# Patient Record
Sex: Male | Born: 1950 | Race: White | Hispanic: No | Marital: Married | Smoking: Never smoker
Health system: Southern US, Community
[De-identification: ages and names within clinical notes are randomized; demographics above are authoritative.]

## PROBLEM LIST (undated history)

## (undated) DIAGNOSIS — M1990 Unspecified osteoarthritis, unspecified site: Secondary | ICD-10-CM

## (undated) DIAGNOSIS — K219 Gastro-esophageal reflux disease without esophagitis: Secondary | ICD-10-CM

## (undated) DIAGNOSIS — G4730 Sleep apnea, unspecified: Secondary | ICD-10-CM

## (undated) DIAGNOSIS — Z8719 Personal history of other diseases of the digestive system: Secondary | ICD-10-CM

## (undated) HISTORY — PX: TONSILLECTOMY AND ADENOIDECTOMY: SUR1326

---
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
## 1990-08-07 HISTORY — PX: APPENDECTOMY: SHX54

## 2007-08-08 HISTORY — PX: COLONOSCOPY: SHX174

## 2008-05-04 ENCOUNTER — Ambulatory Visit: Payer: Self-pay | Admitting: General Surgery

## 2008-05-04 LAB — HM COLONOSCOPY

## 2009-08-07 HISTORY — PX: SHOULDER SURGERY: SHX246

## 2010-03-25 ENCOUNTER — Ambulatory Visit: Payer: Self-pay | Admitting: Specialist

## 2010-04-08 ENCOUNTER — Ambulatory Visit: Payer: Self-pay | Admitting: Specialist

## 2010-04-12 LAB — PATHOLOGY REPORT

## 2010-05-26 ENCOUNTER — Ambulatory Visit: Payer: Self-pay | Admitting: Family Medicine

## 2010-11-19 ENCOUNTER — Ambulatory Visit: Payer: Self-pay | Admitting: Specialist

## 2011-07-11 ENCOUNTER — Ambulatory Visit: Payer: Self-pay | Admitting: Orthopedic Surgery

## 2012-07-08 ENCOUNTER — Ambulatory Visit: Payer: Self-pay | Admitting: Specialist

## 2014-02-11 LAB — BASIC METABOLIC PANEL
CREATININE: 0.9 mg/dL (ref 0.6–1.3)
GLUCOSE: 100 mg/dL

## 2014-02-11 LAB — HEPATIC FUNCTION PANEL: Bilirubin, Total: 0.4 mg/dL

## 2014-02-11 LAB — LIPID PANEL
Cholesterol: 254 mg/dL — AB (ref 0–200)
HDL: 63 mg/dL (ref 35–70)
LDL Cholesterol: 169 mg/dL

## 2014-02-11 LAB — CBC AND DIFFERENTIAL
HEMATOCRIT: 46 % (ref 41–53)
HEMOGLOBIN: 16 g/dL (ref 13.5–17.5)
Neutrophils Absolute: 3 /uL
PLATELETS: 311 10*3/uL (ref 150–399)
WBC: 6.2 10*3/mL

## 2014-02-11 LAB — PSA: PSA: 3.6

## 2014-02-11 LAB — TSH: TSH: 2.19 u[IU]/mL (ref 0.41–5.90)

## 2014-12-09 DIAGNOSIS — Z0000 Encounter for general adult medical examination without abnormal findings: Secondary | ICD-10-CM | POA: Insufficient documentation

## 2015-03-23 ENCOUNTER — Other Ambulatory Visit: Payer: Self-pay

## 2015-03-23 DIAGNOSIS — Z0000 Encounter for general adult medical examination without abnormal findings: Secondary | ICD-10-CM

## 2015-03-23 DIAGNOSIS — Z125 Encounter for screening for malignant neoplasm of prostate: Secondary | ICD-10-CM

## 2015-04-13 LAB — CBC WITH DIFFERENTIAL/PLATELET

## 2015-04-13 LAB — COMPREHENSIVE METABOLIC PANEL
ALBUMIN: 4.9 g/dL — AB (ref 3.6–4.8)
ALT: 27 IU/L (ref 0–44)
AST: 18 IU/L (ref 0–40)
Albumin/Globulin Ratio: 2.1 (ref 1.1–2.5)
Alkaline Phosphatase: 65 IU/L (ref 39–117)
BUN / CREAT RATIO: 19 (ref 10–22)
BUN: 18 mg/dL (ref 8–27)
Bilirubin Total: 0.4 mg/dL (ref 0.0–1.2)
CALCIUM: 9.5 mg/dL (ref 8.6–10.2)
CO2: 21 mmol/L (ref 18–29)
Chloride: 102 mmol/L (ref 97–108)
Creatinine, Ser: 0.95 mg/dL (ref 0.76–1.27)
GFR, EST AFRICAN AMERICAN: 99 mL/min/{1.73_m2} (ref >59)
GFR, EST NON AFRICAN AMERICAN: 85 mL/min/{1.73_m2} (ref >59)
GLUCOSE: 112 mg/dL — AB (ref 65–99)
Globulin, Total: 2.3 g/dL (ref 1.5–4.5)
Potassium: 4.5 mmol/L (ref 3.5–5.2)
Sodium: 145 mmol/L — ABNORMAL HIGH (ref 134–144)
TOTAL PROTEIN: 7.2 g/dL (ref 6.0–8.5)

## 2015-04-13 LAB — LIPID PANEL WITH LDL/HDL RATIO
Cholesterol, Total: 256 mg/dL — ABNORMAL HIGH (ref 100–199)
HDL: 67 mg/dL (ref >39)
LDL CALC: 168 mg/dL — AB (ref 0–99)
LDL/HDL RATIO: 2.5 ratio (ref 0.0–3.6)
Triglycerides: 107 mg/dL (ref 0–149)
VLDL CHOLESTEROL CAL: 21 mg/dL (ref 5–40)

## 2015-04-13 LAB — TSH: TSH: 1.84 u[IU]/mL (ref 0.450–4.500)

## 2015-04-13 LAB — PSA: Prostate Specific Ag, Serum: 3 ng/mL (ref 0.0–4.0)

## 2015-04-16 DIAGNOSIS — R739 Hyperglycemia, unspecified: Secondary | ICD-10-CM | POA: Insufficient documentation

## 2015-06-14 ENCOUNTER — Telehealth: Payer: Self-pay

## 2015-06-14 MED ORDER — AMOXICILLIN-POT CLAVULANATE 875-125 MG PO TABS: 1.0000 | ORAL_TABLET | Freq: Two times a day (BID) | ORAL | Status: DC

## 2015-06-14 NOTE — Telephone Encounter (Signed)
Patient called and states that he has felt bad for the past 3 weeks, cough, congestion, coughing up/blowing out brown green phlegm, sore throat. Per Dr. Rosanna Randy will treat with Augmentin for 10 days and patient advised if he needs to come in due to not feeling any better we will see him

## 2015-12-09 DIAGNOSIS — J45909 Unspecified asthma, uncomplicated: Secondary | ICD-10-CM | POA: Insufficient documentation

## 2015-12-09 DIAGNOSIS — G8929 Other chronic pain: Secondary | ICD-10-CM

## 2015-12-09 DIAGNOSIS — E785 Hyperlipidemia, unspecified: Secondary | ICD-10-CM

## 2015-12-09 DIAGNOSIS — K219 Gastro-esophageal reflux disease without esophagitis: Secondary | ICD-10-CM | POA: Insufficient documentation

## 2015-12-09 DIAGNOSIS — G4733 Obstructive sleep apnea (adult) (pediatric): Secondary | ICD-10-CM

## 2015-12-09 DIAGNOSIS — F411 Generalized anxiety disorder: Secondary | ICD-10-CM

## 2015-12-09 DIAGNOSIS — J309 Allergic rhinitis, unspecified: Secondary | ICD-10-CM | POA: Insufficient documentation

## 2015-12-09 DIAGNOSIS — E782 Mixed hyperlipidemia: Secondary | ICD-10-CM | POA: Insufficient documentation

## 2015-12-09 DIAGNOSIS — J302 Other seasonal allergic rhinitis: Secondary | ICD-10-CM

## 2015-12-15 ENCOUNTER — Encounter: Payer: Self-pay | Admitting: Family Medicine

## 2015-12-15 ENCOUNTER — Ambulatory Visit (INDEPENDENT_AMBULATORY_CARE_PROVIDER_SITE_OTHER): Payer: Commercial Managed Care - HMO | Admitting: Family Medicine

## 2015-12-15 VITALS — BP 132/72 | HR 68 | Temp 98.1°F | Resp 16 | Ht 66.0 in | Wt 167.0 lb

## 2015-12-15 DIAGNOSIS — Z0000 Encounter for general adult medical examination without abnormal findings: Secondary | ICD-10-CM | POA: Diagnosis not present

## 2015-12-15 DIAGNOSIS — Z1211 Encounter for screening for malignant neoplasm of colon: Secondary | ICD-10-CM

## 2015-12-15 DIAGNOSIS — B354 Tinea corporis: Secondary | ICD-10-CM | POA: Diagnosis not present

## 2015-12-15 LAB — POCT URINALYSIS DIPSTICK
Bilirubin, UA: NEGATIVE
Glucose, UA: NEGATIVE
Ketones, UA: NEGATIVE
Leukocytes, UA: NEGATIVE
Nitrite, UA: NEGATIVE
PH UA: 6.5
PROTEIN UA: NEGATIVE
SPEC GRAV UA: 1.025
UROBILINOGEN UA: 0.2

## 2015-12-15 LAB — IFOBT (OCCULT BLOOD): IMMUNOLOGICAL FECAL OCCULT BLOOD TEST: NEGATIVE

## 2015-12-15 MED ORDER — CLOTRIMAZOLE-BETAMETHASONE 1-0.05 % EX CREA
1.0000 | TOPICAL_CREAM | Freq: Two times a day (BID) | CUTANEOUS | Status: DC
Start: 2015-12-15 — End: 2016-12-18

## 2015-12-15 NOTE — Progress Notes (Signed)
Patient ID: Jesse Church, male   DOB: 10/12/1950, 65 y.o.   MRN: [id number]       Patient: Jesse Church, Male    DOB: September 04, 1950, 65 y.o.   MRN: [id number] Visit Date: 12/15/2015  Today's Provider: Wilhemena Durie, MD   Chief Complaint  Patient presents with  . Annual Exam  . Shoulder Pain   Subjective:    Annual physical exam Jesse Church is a 65 y.o. male who presents today for health maintenance and complete physical. He feels well. He reports exercising occasionally. He reports he is sleeping well. Patient reports that he sleeps on average 6-7 hours a night.   patient is a father 60 and the grandfather of 3.  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  Of note is the patient is on no medications for this. He has no relief from nonsteroidals and very little relief from narcotics. He feels that narcotics are not worth the risk. He has seen Dr. Thressa Sheller at [facility] recently after being  followed by Dr. Tamala Julian for year. Dr. Tamala Julian retired earlier this year. Reached his maximum improvement in his shoulder issue in 2011.  Marland Kitchen-----------------------------------------------------------------   Review of Systems  Constitutional: Negative.   HENT: Negative.   Eyes: Negative.   Respiratory: Positive for cough.        Has chronic cough.   Cardiovascular: Negative.   Gastrointestinal: Negative.   Endocrine: Negative.   Genitourinary: Negative.   Musculoskeletal: Negative.   Skin: Negative.   Allergic/Immunologic: Negative.   Neurological: Negative.   Hematological: Negative.   Psychiatric/Behavioral: Negative.     Social History      He  reports that he has never smoked. He does not have any smokeless tobacco history on file. He reports that he drinks alcohol.       Social History   Social History  . Marital  Status: Married    Spouse Name: N/A  . Number of Children: N/A  . Years of Education: N/A   Social History Main Topics  . Smoking status: Never Smoker   . Smokeless tobacco: None  . Alcohol Use: 0.0 oz/week    0 Standard drinks or equivalent per week  . Drug Use: None  . Sexual Activity: Not Asked   Other Topics Concern  . None   Social History Narrative    History reviewed. No pertinent past medical history.   Patient Active Problem List   Diagnosis Date Noted  . Hyperlipidemia 12/09/2015  . GAD (generalized anxiety disorder) 12/09/2015  . OSA (obstructive sleep apnea) 12/09/2015  . Chronic pain 12/09/2015  . Allergic rhinitis 12/09/2015  . Asthma 12/09/2015  . GERD (gastroesophageal reflux disease) 12/09/2015  . Hyperglycemia 04/16/2015    Past Surgical History  Procedure Laterality Date  . Appendectomy    . Tonsillectomy and adenoidectomy    . Shoulder surgery    . Knee surgery      Family History        Family Status  Relation Status Death Age  . Mother Deceased 55  . Father Alive   . Sister Alive         His family history includes Dementia in his paternal grandmother; Hepatitis in his mother.    No Known Allergies  Previous Medications   No medications on file    Patient Care Team:  Richard Maceo Pro., MD as PCP - General (Family Medicine)     Objective:   Vitals: Ht 5\' 6"  (1.676 m)  Wt 167 lb (75.751 kg)  BMI 26.97 kg/m2   Physical Exam  Constitutional: He is oriented to person, place, and time. He appears well-developed and well-nourished.  HENT:  Head: Normocephalic and atraumatic.  Right Ear: External ear normal.  Left Ear: External ear normal.  Nose: Nose normal.  Mouth/Throat: Oropharynx is clear and moist.  Eyes: Conjunctivae are normal.  Neck: Neck supple.  Cardiovascular: Normal rate, normal heart sounds and intact distal pulses.   Pulmonary/Chest: Effort normal and breath sounds normal.  Abdominal: Soft.    Genitourinary: Rectum normal and penis normal.   Very small anus. It is too painful for the patient for many use index finger for DRE. Stool is guaiac negative.  Musculoskeletal: He exhibits no edema.   He has greatly decreased active range of motion in his shoulder and frozen right shoulder. He has severe pain with any Abduction of the right shoulder and is only able to lift his right arm about 30 before he starts to have significant pain  Neurological: He is alert and oriented to person, place, and time. No cranial nerve deficit. He exhibits normal muscle tone. Coordination normal.  Skin: Skin is warm and dry.  Psychiatric: He has a normal mood and affect. His behavior is normal. Judgment and thought content normal.     Assessment & Plan:     Routine Health Maintenance and Physical Exam  Exercise Activities and Dietary recommendations Goals    None      Immunization History  Administered Date(s) Administered  . Zoster 12/09/2014    Health Maintenance  Topic Date Due  . Hepatitis C Screening  08/12/1950  . HIV Screening  08/06/1967  . TETANUS/TDAP  08/06/1971  . INFLUENZA VACCINE  03/07/2016  . COLONOSCOPY  05/04/2018  . ZOSTAVAX  Completed    return to clinic 1 year   Repeat colonoscopy 2019. Discussed health benefits of physical activity, and encouraged him to engage in regular exercise appropriate for his age and condition.    --------------------------------------------------------------------

## 2016-12-18 ENCOUNTER — Encounter: Payer: Self-pay | Admitting: Family Medicine

## 2016-12-18 ENCOUNTER — Ambulatory Visit (INDEPENDENT_AMBULATORY_CARE_PROVIDER_SITE_OTHER): Payer: BLUE CROSS/BLUE SHIELD | Admitting: Family Medicine

## 2016-12-18 VITALS — BP 130/74 | HR 96 | Temp 98.3°F | Resp 16 | Ht 66.0 in | Wt 168.0 lb

## 2016-12-18 DIAGNOSIS — R3121 Asymptomatic microscopic hematuria: Secondary | ICD-10-CM

## 2016-12-18 DIAGNOSIS — G8929 Other chronic pain: Secondary | ICD-10-CM

## 2016-12-18 DIAGNOSIS — M25511 Pain in right shoulder: Secondary | ICD-10-CM | POA: Diagnosis not present

## 2016-12-18 DIAGNOSIS — M25512 Pain in left shoulder: Secondary | ICD-10-CM

## 2016-12-18 DIAGNOSIS — Z0000 Encounter for general adult medical examination without abnormal findings: Secondary | ICD-10-CM

## 2016-12-18 DIAGNOSIS — R319 Hematuria, unspecified: Secondary | ICD-10-CM | POA: Insufficient documentation

## 2016-12-18 DIAGNOSIS — E7800 Pure hypercholesterolemia, unspecified: Secondary | ICD-10-CM

## 2016-12-18 DIAGNOSIS — Z125 Encounter for screening for malignant neoplasm of prostate: Secondary | ICD-10-CM | POA: Diagnosis not present

## 2016-12-18 DIAGNOSIS — R739 Hyperglycemia, unspecified: Secondary | ICD-10-CM

## 2016-12-18 LAB — POCT URINALYSIS DIPSTICK
BILIRUBIN UA: NEGATIVE
GLUCOSE UA: NEGATIVE
KETONES UA: NEGATIVE
LEUKOCYTES UA: NEGATIVE
Nitrite, UA: NEGATIVE
PH UA: 6 (ref 5.0–8.0)
Protein, UA: NEGATIVE
Spec Grav, UA: 1.02 (ref 1.010–1.025)
Urobilinogen, UA: 0.2 E.U./dL

## 2016-12-18 NOTE — Progress Notes (Signed)
Patient: Jesse Church, Male    DOB: [birth year], 66 y.o.   MRN: [id number] Visit Date: 12/18/2016  Today's Provider: Wilhemena Durie, MD   Chief Complaint  Patient presents with  . Annual Exam   Subjective:    Annual physical exam Jesse Church is a 66 y.o. male who presents today for health maintenance and complete physical. He feels fairly well. Pt does have long-standing shoulder pain that prohibits him from swimming, throwing ball, etc. He reports exercising 2 times a week from 30 minutes to 2 hours. Pt cycles and walks. He reports he is sleeping well. 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 Last colonoscopy- 05/04/2008- diverticulosis, internal hemorrhoids. Repeat 10 years. -----------------------------------------------------------------   Review of Systems  Constitutional: Negative.   HENT: Negative.   Eyes: Negative.   Respiratory: Negative.   Cardiovascular: Negative.   Gastrointestinal: Negative.   Endocrine: Negative.   Genitourinary: Negative.   Musculoskeletal: Positive for arthralgias. Negative for back pain, gait problem, joint swelling, myalgias, neck pain and neck stiffness.  Skin: Negative.   Allergic/Immunologic: Positive for environmental allergies. Negative for food allergies and immunocompromised state.  Neurological: Negative.   Hematological: Negative.   Psychiatric/Behavioral: Negative.     Social History      He  reports that he has never smoked. He has never used smokeless tobacco. He reports that he drinks about 4.2 oz of alcohol per week . He reports that he does not use drugs.       Social History   Social History  . Marital status: Married    Spouse name: Butch Penny  . Number of children: 3  . Years of education: college   Occupational History  . Retired    Social History Main Topics  . Smoking status: Never Smoker  . Smokeless tobacco: Never Used  . Alcohol use 4.2 oz/week    7 Glasses of wine per week       Comment: 1 glass of wine with dinner  . Drug use: No  . Sexual activity: Not Asked   Other Topics Concern  . None   Social History Narrative  . None    History reviewed. No pertinent past medical history.   Patient Active Problem List   Diagnosis Date Noted  . Hyperlipidemia 12/09/2015  . GAD (generalized anxiety disorder) 12/09/2015  . OSA (obstructive sleep apnea) 12/09/2015  . Chronic pain 12/09/2015  . Allergic rhinitis 12/09/2015  . Asthma 12/09/2015  . GERD (gastroesophageal reflux disease) 12/09/2015  . Hyperglycemia 04/16/2015    Past Surgical History:  Procedure Laterality Date  . APPENDECTOMY    . KNEE SURGERY    . SHOULDER SURGERY    . TONSILLECTOMY AND ADENOIDECTOMY      Family History        Family Status  Relation Status  . Mother Deceased at age 75  . Father Alive  . Sister Alive  . PGM (Not Specified)        His family history includes Dementia in his father and paternal grandmother; Healthy in his sister; Hepatitis in his mother.     No Known Allergies  No current outpatient prescriptions on file.   Patient Care Team: Jerrol Banana., MD as PCP - General (Family Medicine)      Objective:   Vitals: BP 130/74 (BP Location: Left Arm, Patient Position: Sitting, Cuff Size: Normal)   Pulse 96   Temp 98.3 F (36.8 C) (Oral)  Resp 16   Ht 5\' 6"  (1.676 m)   Wt 168 lb (76.2 kg)   BMI 27.12 kg/m    Vitals:   12/18/16 1407  BP: 130/74  Pulse: 96  Resp: 16  Temp: 98.3 F (36.8 C)  TempSrc: Oral  Weight: 168 lb (76.2 kg)  Height: 5\' 6"  (1.676 m)     Physical Exam  Constitutional: He is oriented to person, place, and time. He appears well-developed and well-nourished. No distress.  HENT:  Head: Normocephalic and atraumatic.  Right Ear: Tympanic membrane and external ear normal.  Left Ear: Tympanic membrane and external ear normal.  Nose: Nose normal.  Mouth/Throat: Oropharynx is clear and moist. No oropharyngeal  exudate.  Eyes: Conjunctivae are normal. Right eye exhibits no discharge. Left eye exhibits no discharge.  Neck: Normal range of motion. Neck supple. No tracheal deviation present. No thyromegaly present.  Cardiovascular: Normal rate, regular rhythm and normal heart sounds.   Pulmonary/Chest: Effort normal and breath sounds normal. No respiratory distress.  Abdominal: Soft. There is no tenderness.  Genitourinary:  Genitourinary Comments: DRE deferred.  Musculoskeletal: Normal range of motion. He exhibits no edema.  He can raise left shoulder to 90 degrees ,right to max of 70 degrees.  Lymphadenopathy:    He has no cervical adenopathy.  Neurological: He is alert and oriented to person, place, and time. He has normal reflexes.  Skin: Skin is warm and dry. No rash noted. He is not diaphoretic. No erythema. No pallor.  Psychiatric: He has a normal mood and affect. His behavior is normal. Judgment and thought content normal.     Depression Screen PHQ 2/9 Scores 12/18/2016  PHQ - 2 Score 0  PHQ- 9 Score 0      Assessment & Plan:     Routine Health Maintenance and Physical Exam  Exercise Activities and Dietary recommendations Goals    None      Immunization History  Administered Date(s) Administered  . Zoster 12/09/2014    Health Maintenance  Topic Date Due  . Hepatitis C Screening  19-May-1951  . HIV Screening  08/06/1967  . TETANUS/TDAP  08/06/1971  . INFLUENZA VACCINE  03/07/2017  . COLONOSCOPY  05/04/2018     Discussed health benefits of physical activity, and encouraged him to engage in regular exercise appropriate for his age and condition.    -------------------------------------------------------------------- 1. Annual physical exam Stable. Colonoscopy 2019. - POCT urinalysis dipstick Results for orders placed or performed in visit on 12/18/16  POCT urinalysis dipstick  Result Value Ref Range   Color, UA yellow    Clarity, UA clear    Glucose, UA Negative     Bilirubin, UA Negative    Ketones, UA Negative    Spec Grav, UA 1.020 1.010 - 1.025   Blood, UA NH Moderate    pH, UA 6.0 5.0 - 8.0   Protein, UA Negative    Urobilinogen, UA 0.2 0.2 or 1.0 E.U./dL   Nitrite, UA Negative    Leukocytes, UA Negative Negative     2. Chronic pain of both shoulders Stable.  3. Pure hypercholesterolemia Pt has a H/O this. FU pending result. - TSH - Lipid panel - CBC with Differential/Platelet - Comprehensive metabolic panel  4. Hyperglycemia Pt has a H/O this. FU pending result. - Hemoglobin A1c  5. Screening for prostate cancer - PSA   6. Asymptomatic microscopic hematuria Pt has a H/O this. 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  I have done the exam and reviewed the above chart and it is accurate to the best of my knowledge. Development worker, community has been used in this note in any air is in the dictation or transcription are unintentional.  Wilhemena Durie, MD  [place]

## 2016-12-29 ENCOUNTER — Other Ambulatory Visit: Payer: Self-pay | Admitting: Family Medicine

## 2016-12-30 LAB — CBC WITH DIFFERENTIAL/PLATELET
Basophils Absolute: 0 10*3/uL (ref 0.0–0.2)
Basos: 1 %
EOS (ABSOLUTE): 0.1 10*3/uL (ref 0.0–0.4)
Eos: 2 %
HEMOGLOBIN: 15.5 g/dL (ref 13.0–17.7)
Hematocrit: 46.7 % (ref 37.5–51.0)
Immature Grans (Abs): 0 10*3/uL (ref 0.0–0.1)
Immature Granulocytes: 0 %
LYMPHS ABS: 2.4 10*3/uL (ref 0.7–3.1)
Lymphs: 38 %
MCH: 28.2 pg (ref 26.6–33.0)
MCHC: 33.2 g/dL (ref 31.5–35.7)
MCV: 85 fL (ref 79–97)
MONOCYTES: 10 %
Monocytes Absolute: 0.6 10*3/uL (ref 0.1–0.9)
NEUTROS ABS: 3.2 10*3/uL (ref 1.4–7.0)
Neutrophils: 49 %
Platelets: 243 10*3/uL (ref 150–379)
RBC: 5.49 x10E6/uL (ref 4.14–5.80)
RDW: 14.7 % (ref 12.3–15.4)
WBC: 6.3 10*3/uL (ref 3.4–10.8)

## 2016-12-30 LAB — LIPID PANEL
CHOLESTEROL TOTAL: 255 mg/dL — AB (ref 100–199)
Chol/HDL Ratio: 3.8 ratio (ref 0.0–5.0)
HDL: 68 mg/dL (ref >39)
LDL Calculated: 173 mg/dL — ABNORMAL HIGH (ref 0–99)
Triglycerides: 72 mg/dL (ref 0–149)
VLDL Cholesterol Cal: 14 mg/dL (ref 5–40)

## 2016-12-30 LAB — COMPREHENSIVE METABOLIC PANEL
ALBUMIN: 4.5 g/dL (ref 3.6–4.8)
ALK PHOS: 68 IU/L (ref 39–117)
ALT: 22 IU/L (ref 0–44)
AST: 19 IU/L (ref 0–40)
Albumin/Globulin Ratio: 1.7 (ref 1.2–2.2)
BILIRUBIN TOTAL: 0.6 mg/dL (ref 0.0–1.2)
BUN/Creatinine Ratio: 19 (ref 10–24)
BUN: 19 mg/dL (ref 8–27)
CO2: 23 mmol/L (ref 18–29)
Calcium: 9.6 mg/dL (ref 8.6–10.2)
Chloride: 102 mmol/L (ref 96–106)
Creatinine, Ser: 0.99 mg/dL (ref 0.76–1.27)
GFR calc non Af Amer: 80 mL/min/{1.73_m2} (ref >59)
GFR, EST AFRICAN AMERICAN: 93 mL/min/{1.73_m2} (ref >59)
Globulin, Total: 2.6 g/dL (ref 1.5–4.5)
Glucose: 112 mg/dL — ABNORMAL HIGH (ref 65–99)
Potassium: 4.3 mmol/L (ref 3.5–5.2)
SODIUM: 143 mmol/L (ref 134–144)
TOTAL PROTEIN: 7.1 g/dL (ref 6.0–8.5)

## 2016-12-30 LAB — PSA: Prostate Specific Ag, Serum: 3.9 ng/mL (ref 0.0–4.0)

## 2016-12-30 LAB — TSH: TSH: 1.83 u[IU]/mL (ref 0.450–4.500)

## 2016-12-30 LAB — HEMOGLOBIN A1C
Est. average glucose Bld gHb Est-mCnc: 120 mg/dL
Hgb A1c MFr Bld: 5.8 % — ABNORMAL HIGH (ref 4.8–5.6)

## 2017-08-07 HISTORY — PX: JOINT REPLACEMENT: SHX530

## 2017-08-07 HISTORY — PX: OTHER SURGICAL HISTORY: SHX169

## 2017-08-07 HISTORY — PX: IMPLANTABLE CONTACT LENS IMPLANTATION: SHX1792

## 2017-08-27 DIAGNOSIS — H6123 Impacted cerumen, bilateral: Secondary | ICD-10-CM | POA: Diagnosis not present

## 2017-08-27 DIAGNOSIS — H902 Conductive hearing loss, unspecified: Secondary | ICD-10-CM | POA: Diagnosis not present

## 2017-10-30 DIAGNOSIS — M170 Bilateral primary osteoarthritis of knee: Secondary | ICD-10-CM | POA: Diagnosis not present

## 2017-10-30 DIAGNOSIS — M1120 Other chondrocalcinosis, unspecified site: Secondary | ICD-10-CM | POA: Insufficient documentation

## 2017-11-15 DIAGNOSIS — M1120 Other chondrocalcinosis, unspecified site: Secondary | ICD-10-CM | POA: Diagnosis not present

## 2017-11-15 DIAGNOSIS — M170 Bilateral primary osteoarthritis of knee: Secondary | ICD-10-CM | POA: Diagnosis not present

## 2018-01-10 DIAGNOSIS — S83231A Complex tear of medial meniscus, current injury, right knee, initial encounter: Secondary | ICD-10-CM | POA: Diagnosis not present

## 2018-01-10 DIAGNOSIS — Z96651 Presence of right artificial knee joint: Secondary | ICD-10-CM | POA: Insufficient documentation

## 2018-01-17 DIAGNOSIS — S83231D Complex tear of medial meniscus, current injury, right knee, subsequent encounter: Secondary | ICD-10-CM | POA: Diagnosis not present

## 2018-01-17 DIAGNOSIS — X58XXXA Exposure to other specified factors, initial encounter: Secondary | ICD-10-CM | POA: Diagnosis not present

## 2018-01-17 DIAGNOSIS — M25461 Effusion, right knee: Secondary | ICD-10-CM | POA: Diagnosis not present

## 2018-01-17 DIAGNOSIS — S83231A Complex tear of medial meniscus, current injury, right knee, initial encounter: Secondary | ICD-10-CM | POA: Diagnosis not present

## 2018-01-17 DIAGNOSIS — M87051 Idiopathic aseptic necrosis of right femur: Secondary | ICD-10-CM | POA: Diagnosis not present

## 2018-01-17 DIAGNOSIS — S83411A Sprain of medial collateral ligament of right knee, initial encounter: Secondary | ICD-10-CM | POA: Diagnosis not present

## 2018-02-12 DIAGNOSIS — M87051 Idiopathic aseptic necrosis of right femur: Secondary | ICD-10-CM | POA: Diagnosis not present

## 2018-02-16 DIAGNOSIS — S0511XA Contusion of eyeball and orbital tissues, right eye, initial encounter: Secondary | ICD-10-CM | POA: Diagnosis not present

## 2018-02-17 DIAGNOSIS — S0511XD Contusion of eyeball and orbital tissues, right eye, subsequent encounter: Secondary | ICD-10-CM | POA: Diagnosis not present

## 2018-02-18 DIAGNOSIS — S0511XD Contusion of eyeball and orbital tissues, right eye, subsequent encounter: Secondary | ICD-10-CM | POA: Diagnosis not present

## 2018-02-19 DIAGNOSIS — S0590XA Unspecified injury of unspecified eye and orbit, initial encounter: Secondary | ICD-10-CM | POA: Diagnosis not present

## 2018-02-19 DIAGNOSIS — M170 Bilateral primary osteoarthritis of knee: Secondary | ICD-10-CM | POA: Diagnosis not present

## 2018-02-19 DIAGNOSIS — S83231S Complex tear of medial meniscus, current injury, right knee, sequela: Secondary | ICD-10-CM | POA: Diagnosis not present

## 2018-02-19 DIAGNOSIS — M25511 Pain in right shoulder: Secondary | ICD-10-CM | POA: Diagnosis not present

## 2018-02-19 DIAGNOSIS — S83231A Complex tear of medial meniscus, current injury, right knee, initial encounter: Secondary | ICD-10-CM | POA: Diagnosis not present

## 2018-02-19 DIAGNOSIS — Z01818 Encounter for other preprocedural examination: Secondary | ICD-10-CM | POA: Diagnosis not present

## 2018-02-19 DIAGNOSIS — M1120 Other chondrocalcinosis, unspecified site: Secondary | ICD-10-CM | POA: Diagnosis not present

## 2018-02-19 DIAGNOSIS — M25512 Pain in left shoulder: Secondary | ICD-10-CM | POA: Diagnosis not present

## 2018-02-19 DIAGNOSIS — G4730 Sleep apnea, unspecified: Secondary | ICD-10-CM | POA: Diagnosis not present

## 2018-02-19 DIAGNOSIS — K219 Gastro-esophageal reflux disease without esophagitis: Secondary | ICD-10-CM | POA: Diagnosis not present

## 2018-02-19 DIAGNOSIS — G8929 Other chronic pain: Secondary | ICD-10-CM | POA: Insufficient documentation

## 2018-02-19 DIAGNOSIS — E782 Mixed hyperlipidemia: Secondary | ICD-10-CM | POA: Diagnosis not present

## 2018-02-19 DIAGNOSIS — R7303 Prediabetes: Secondary | ICD-10-CM | POA: Diagnosis not present

## 2018-02-20 ENCOUNTER — Encounter: Payer: Self-pay | Admitting: Family Medicine

## 2018-02-20 ENCOUNTER — Ambulatory Visit (INDEPENDENT_AMBULATORY_CARE_PROVIDER_SITE_OTHER): Payer: Medicare Other | Admitting: Family Medicine

## 2018-02-20 VITALS — BP 142/70 | HR 88 | Temp 99.1°F | Resp 16 | Wt 168.0 lb

## 2018-02-20 DIAGNOSIS — M19212 Secondary osteoarthritis, left shoulder: Secondary | ICD-10-CM | POA: Diagnosis not present

## 2018-02-20 DIAGNOSIS — S058X1A Other injuries of right eye and orbit, initial encounter: Secondary | ICD-10-CM | POA: Diagnosis not present

## 2018-02-20 DIAGNOSIS — M25561 Pain in right knee: Secondary | ICD-10-CM

## 2018-02-20 DIAGNOSIS — M19211 Secondary osteoarthritis, right shoulder: Secondary | ICD-10-CM

## 2018-02-20 NOTE — Progress Notes (Signed)
       Patient: Jesse Church Male    DOB: Jul 13, 1951   67 y.o.   MRN: [id number] Visit Date: 02/20/2018  Today's Provider: Wilhemena Durie, MD   Chief Complaint  Patient presents with  . discuss surgery   Subjective:    HPI Patient comes in today wanting to discuss his upcoming knee surgery. He is scheduled to have total knee replacement surgery on 02/27/18 by Dr. Enrigue Catena at [facility].  He recently was struck in right eye by flying object while mowing his yard. His shoulder pain/limitation continues.    No Known Allergies   Current Outpatient Medications:  .  cyclopentolate (CYCLODRYL,CYCLOGYL) 1 % ophthalmic solution, Apply 1 drop to eye daily., Disp: , Rfl:  .  amoxicillin-clavulanate (AUGMENTIN) 875-125 MG tablet, Take 1 tablet by mouth 2 (two) times daily. (Patient not taking: Reported on 02/20/2018), Disp: 20 tablet, Rfl: 0  Review of Systems  Constitutional: Positive for activity change.  HENT: Negative.   Eyes: Positive for photophobia, pain and redness.  Cardiovascular: Negative.   Endocrine: Negative.   Musculoskeletal: Positive for arthralgias, joint swelling and myalgias.  Allergic/Immunologic: Negative.   Neurological: Negative.   Psychiatric/Behavioral: Negative.     Social History   Tobacco Use  . Smoking status: Never Smoker  . Smokeless tobacco: Never Used  Substance Use Topics  . Alcohol use: Yes    Alcohol/week: 4.2 oz    Types: 7 Glasses of wine per week    Comment: 1 glass of wine with dinner   Objective:   BP (!) 142/70 (BP Location: Left Arm, Patient Position: Sitting, Cuff Size: Normal)   Pulse 88   Temp 99.1 F (37.3 C)   Resp 16   Wt 168 lb (76.2 kg)   SpO2 96%   BMI 27.12 kg/m  Vitals:   02/20/18 1612  BP: (!) 142/70  Pulse: 88  Resp: 16  Temp: 99.1 F (37.3 C)  SpO2: 96%  Weight: 168 lb (76.2 kg)     Physical Exam  Constitutional: He appears well-developed and well-nourished.  HENT:  Head: Normocephalic and  atraumatic.  Right Ear: External ear normal.  Left Ear: External ear normal.  Nose: Nose normal.  Eyes: Conjunctivae are normal. No scleral icterus.  Right pupil dilated. Conjuctiva red.  Neck: No thyromegaly present.        Assessment & Plan:     Knee Pain Pt for partial knee arthroplasty Shoulder Arthropathy/Chronic frozen shoulder Pt completely unable to practice Ob/Gyn at all. Recent Right Eye Trauma Per Dr Michelene Heady.    I have done the exam and reviewed the chart and it is accurate to the best of my knowledge. Development worker, community has been used and  any errors in dictation or transcription are unintentional. Miguel Aschoff M.D. [place], MD  [facility]

## 2018-02-21 DIAGNOSIS — S0511XD Contusion of eyeball and orbital tissues, right eye, subsequent encounter: Secondary | ICD-10-CM | POA: Diagnosis not present

## 2018-02-25 DIAGNOSIS — H40039 Anatomical narrow angle, unspecified eye: Secondary | ICD-10-CM | POA: Diagnosis not present

## 2018-02-25 DIAGNOSIS — S0511XD Contusion of eyeball and orbital tissues, right eye, subsequent encounter: Secondary | ICD-10-CM | POA: Diagnosis not present

## 2018-02-27 ENCOUNTER — Encounter: Payer: BLUE CROSS/BLUE SHIELD | Admitting: Family Medicine

## 2018-02-27 DIAGNOSIS — G8929 Other chronic pain: Secondary | ICD-10-CM | POA: Diagnosis not present

## 2018-02-27 DIAGNOSIS — K219 Gastro-esophageal reflux disease without esophagitis: Secondary | ICD-10-CM | POA: Diagnosis not present

## 2018-02-27 DIAGNOSIS — M1712 Unilateral primary osteoarthritis, left knee: Secondary | ICD-10-CM | POA: Diagnosis not present

## 2018-02-27 DIAGNOSIS — G8918 Other acute postprocedural pain: Secondary | ICD-10-CM | POA: Diagnosis not present

## 2018-02-27 DIAGNOSIS — E782 Mixed hyperlipidemia: Secondary | ICD-10-CM | POA: Diagnosis not present

## 2018-02-27 DIAGNOSIS — M87051 Idiopathic aseptic necrosis of right femur: Secondary | ICD-10-CM | POA: Diagnosis not present

## 2018-02-27 DIAGNOSIS — S83231A Complex tear of medial meniscus, current injury, right knee, initial encounter: Secondary | ICD-10-CM | POA: Diagnosis not present

## 2018-02-27 DIAGNOSIS — M25512 Pain in left shoulder: Secondary | ICD-10-CM | POA: Diagnosis not present

## 2018-02-27 DIAGNOSIS — R7303 Prediabetes: Secondary | ICD-10-CM | POA: Diagnosis not present

## 2018-02-27 DIAGNOSIS — M25561 Pain in right knee: Secondary | ICD-10-CM | POA: Diagnosis not present

## 2018-02-27 DIAGNOSIS — M25511 Pain in right shoulder: Secondary | ICD-10-CM | POA: Diagnosis not present

## 2018-02-27 DIAGNOSIS — G4733 Obstructive sleep apnea (adult) (pediatric): Secondary | ICD-10-CM | POA: Diagnosis not present

## 2018-02-27 DIAGNOSIS — M1711 Unilateral primary osteoarthritis, right knee: Secondary | ICD-10-CM | POA: Diagnosis not present

## 2018-02-28 DIAGNOSIS — M87051 Idiopathic aseptic necrosis of right femur: Secondary | ICD-10-CM | POA: Diagnosis not present

## 2018-02-28 DIAGNOSIS — S83231A Complex tear of medial meniscus, current injury, right knee, initial encounter: Secondary | ICD-10-CM | POA: Diagnosis not present

## 2018-02-28 DIAGNOSIS — R7303 Prediabetes: Secondary | ICD-10-CM | POA: Diagnosis not present

## 2018-02-28 DIAGNOSIS — M1712 Unilateral primary osteoarthritis, left knee: Secondary | ICD-10-CM | POA: Diagnosis not present

## 2018-02-28 DIAGNOSIS — E782 Mixed hyperlipidemia: Secondary | ICD-10-CM | POA: Diagnosis not present

## 2018-02-28 DIAGNOSIS — K219 Gastro-esophageal reflux disease without esophagitis: Secondary | ICD-10-CM | POA: Diagnosis not present

## 2018-02-28 DIAGNOSIS — M1711 Unilateral primary osteoarthritis, right knee: Secondary | ICD-10-CM | POA: Diagnosis not present

## 2018-02-28 DIAGNOSIS — G4733 Obstructive sleep apnea (adult) (pediatric): Secondary | ICD-10-CM | POA: Diagnosis not present

## 2018-03-04 DIAGNOSIS — Z4789 Encounter for other orthopedic aftercare: Secondary | ICD-10-CM | POA: Diagnosis not present

## 2018-03-06 DIAGNOSIS — Z4789 Encounter for other orthopedic aftercare: Secondary | ICD-10-CM | POA: Diagnosis not present

## 2018-03-12 DIAGNOSIS — Z4789 Encounter for other orthopedic aftercare: Secondary | ICD-10-CM | POA: Diagnosis not present

## 2018-03-14 DIAGNOSIS — Z4789 Encounter for other orthopedic aftercare: Secondary | ICD-10-CM | POA: Diagnosis not present

## 2018-03-15 DIAGNOSIS — S0511XD Contusion of eyeball and orbital tissues, right eye, subsequent encounter: Secondary | ICD-10-CM | POA: Diagnosis not present

## 2018-03-18 DIAGNOSIS — Z4789 Encounter for other orthopedic aftercare: Secondary | ICD-10-CM | POA: Diagnosis not present

## 2018-03-21 DIAGNOSIS — H27111 Subluxation of lens, right eye: Secondary | ICD-10-CM | POA: Diagnosis not present

## 2018-03-25 DIAGNOSIS — Z4789 Encounter for other orthopedic aftercare: Secondary | ICD-10-CM | POA: Diagnosis not present

## 2018-03-27 DIAGNOSIS — H43811 Vitreous degeneration, right eye: Secondary | ICD-10-CM | POA: Diagnosis not present

## 2018-03-27 DIAGNOSIS — H27111 Subluxation of lens, right eye: Secondary | ICD-10-CM | POA: Diagnosis not present

## 2018-03-27 DIAGNOSIS — H27121 Anterior dislocation of lens, right eye: Secondary | ICD-10-CM | POA: Diagnosis not present

## 2018-03-27 DIAGNOSIS — H2710 Unspecified dislocation of lens: Secondary | ICD-10-CM | POA: Diagnosis not present

## 2018-03-27 DIAGNOSIS — H2701 Aphakia, right eye: Secondary | ICD-10-CM | POA: Diagnosis not present

## 2018-04-09 DIAGNOSIS — M87051 Idiopathic aseptic necrosis of right femur: Secondary | ICD-10-CM | POA: Diagnosis not present

## 2018-04-09 DIAGNOSIS — Z96651 Presence of right artificial knee joint: Secondary | ICD-10-CM | POA: Diagnosis not present

## 2018-04-09 DIAGNOSIS — Z471 Aftercare following joint replacement surgery: Secondary | ICD-10-CM | POA: Diagnosis not present

## 2018-05-28 DIAGNOSIS — Z96651 Presence of right artificial knee joint: Secondary | ICD-10-CM | POA: Diagnosis not present

## 2018-05-28 DIAGNOSIS — M25461 Effusion, right knee: Secondary | ICD-10-CM | POA: Diagnosis not present

## 2018-05-28 DIAGNOSIS — Z471 Aftercare following joint replacement surgery: Secondary | ICD-10-CM | POA: Diagnosis not present

## 2018-06-24 DIAGNOSIS — Z961 Presence of intraocular lens: Secondary | ICD-10-CM | POA: Diagnosis not present

## 2018-06-26 ENCOUNTER — Ambulatory Visit (INDEPENDENT_AMBULATORY_CARE_PROVIDER_SITE_OTHER): Payer: Medicare Other | Admitting: Family Medicine

## 2018-06-26 ENCOUNTER — Encounter: Payer: Self-pay | Admitting: Family Medicine

## 2018-06-26 VITALS — BP 142/78 | HR 84 | Temp 98.2°F | Resp 16 | Ht 66.0 in | Wt 171.0 lb

## 2018-06-26 DIAGNOSIS — Z23 Encounter for immunization: Secondary | ICD-10-CM | POA: Diagnosis not present

## 2018-06-26 DIAGNOSIS — Z0000 Encounter for general adult medical examination without abnormal findings: Secondary | ICD-10-CM | POA: Diagnosis not present

## 2018-06-26 NOTE — Progress Notes (Signed)
Patient: Jesse Church, Male    DOB: Jun 14, 1951, 67 y.o.   MRN: [id number] Visit Date: 06/26/2018  Today's Provider: Wilhemena Durie, MD   Chief Complaint  Patient presents with  . Annual Exam   Subjective:  Welcome to Medicare visit   Welcome to Medicare  visit Jesse Church Jesse Church is a 67 y.o. male. He feels well. He reports exercising not regularly, but he does stay active. He reports he is sleeping well. Patient is a retired Engineer, drilling, [place], he is married and is a father of 4.  He has 7 grandchildren and 2 more on the way.  It is of note that is 20 year old daughter was recently diagnosed with ovarian cancer.  Colonoscopy- 05/04/2008. Normal. Repeat in 10 years.    Review of Systems  Constitutional: Negative.   HENT: Negative.   Eyes: Negative.   Respiratory: Negative.   Cardiovascular: Negative.   Gastrointestinal: Negative.   Endocrine: Negative.   Genitourinary: Negative.   Musculoskeletal: Positive for arthralgias.  Skin: Negative.   Allergic/Immunologic: Negative.   Neurological: Negative.   Hematological: Negative.   Psychiatric/Behavioral: Negative.     Social History   Socioeconomic History  . Marital status: Married    Spouse name: Jesse Church  . Number of children: 3  . Years of education: college  . Highest education level: Not on file  Occupational History  . Occupation: Retired  Scientific laboratory technician  . Financial resource strain: Not on file  . Food insecurity:    Worry: Not on file    Inability: Not on file  . Transportation needs:    Medical: Not on file    Non-medical: Not on file  Tobacco Use  . Smoking status: Never Smoker  . Smokeless tobacco: Never Used  Substance and Sexual Activity  . Alcohol use: Yes    Alcohol/week: 7.0 standard drinks    Types: 7 Glasses of wine per week    Comment: 1 glass of wine with dinner  . Drug use: No  . Sexual activity: Not on file  Lifestyle  . Physical activity:    Days per week: Not on file     Minutes per session: Not on file  . Stress: Not on file  Relationships  . Social connections:    Talks on phone: Not on file    Gets together: Not on file    Attends religious service: Not on file    Active member of club or organization: Not on file    Attends meetings of clubs or organizations: Not on file    Relationship status: Not on file  . Intimate partner violence:    Fear of current or ex partner: Not on file    Emotionally abused: Not on file    Physically abused: Not on file    Forced sexual activity: Not on file  Other Topics Concern  . Not on file  Social History Narrative  . Not on file    No past medical history on file.   Patient Active Problem List   Diagnosis Date Noted  . Hematuria 12/18/2016  . Hyperlipidemia 12/09/2015  . GAD (generalized anxiety disorder) 12/09/2015  . OSA (obstructive sleep apnea) 12/09/2015  . Chronic pain 12/09/2015  . Allergic rhinitis 12/09/2015  . Asthma 12/09/2015  . GERD (gastroesophageal reflux disease) 12/09/2015  . Hyperglycemia 04/16/2015    Past Surgical History:  Procedure Laterality Date  . APPENDECTOMY    . KNEE SURGERY    .  SHOULDER SURGERY    . TONSILLECTOMY AND ADENOIDECTOMY      His family history includes Dementia in his father and paternal grandmother; Healthy in his sister; Hepatitis in his mother.      Current Outpatient Medications:  .  amoxicillin-clavulanate (AUGMENTIN) 875-125 MG tablet, Take 1 tablet by mouth 2 (two) times daily. (Patient not taking: Reported on 02/20/2018), Disp: 20 tablet, Rfl: 0 .  cyclopentolate (CYCLODRYL,CYCLOGYL) 1 % ophthalmic solution, Apply 1 drop to eye daily., Disp: , Rfl:   Patient Care Team: Jerrol Banana., MD as PCP - General (Family Medicine)     Objective:   Vitals: BP (!) 142/78 (BP Location: Left Arm, Patient Position: Sitting, Cuff Size: Normal)   Pulse 84   Temp 98.2 F (36.8 C)   Resp 16   Ht 5\' 6"  (1.676 m)   Wt 171 lb (77.6 kg)   SpO2  96%   BMI 27.60 kg/m   Physical Exam  Constitutional: He is oriented to person, place, and time. He appears well-developed and well-nourished.  HENT:  Head: Normocephalic and atraumatic.  Right Ear: External ear normal.  Left Ear: External ear normal.  Nose: Nose normal.  Mouth/Throat: Oropharynx is clear and moist.  Eyes: Conjunctivae and EOM are normal.  Right pupil is irregular and dilated from recent trauma.  Has  been surgically repaired.  Neck: No thyromegaly present.  Cardiovascular: Normal rate, regular rhythm and normal heart sounds.  Pulmonary/Chest: Effort normal and breath sounds normal.  Abdominal: Soft.  Genitourinary: Penis normal.  Musculoskeletal: He exhibits no edema.  He has very decreased range of motion in both shoulders and has had for several years.  Lymphadenopathy:    He has no cervical adenopathy.  Neurological: He is alert and oriented to person, place, and time.  Skin: Skin is warm and dry.  Psychiatric: He has a normal mood and affect. His behavior is normal. Judgment and thought content normal.    Activities of Daily Living In your present state of health, do you have any difficulty performing the following activities: 06/26/2018  Hearing? N  Vision? N  Difficulty concentrating or making decisions? N  Walking or climbing stairs? Y  Dressing or bathing? N  Doing errands, shopping? N  Some recent data might be hidden    Fall Risk Assessment Fall Risk  06/26/2018 12/18/2016  Falls in the past year? 0 No     Depression Screen PHQ 2/9 Scores 06/26/2018 12/18/2016  PHQ - 2 Score 0 0  PHQ- 9 Score - 0    Cognitive Testing - 6-CIT  Correct? Score   What year is it? yes 0 0 or 4  What month is it? yes 0 0 or 3  Memorize:    Jesse Church,  43,  [place],      What time is it? (within 1 hour) yes 0 0 or 3  Count backwards from 20 yes 0 0, 2, or 4  Name the months of the year yes 0 0, 2, or 4  Repeat name & address above yes 0 0, 2,  4, 6, 8, or 10       TOTAL SCORE  0/28   Interpretation:  Normal  Normal (0-7) Abnormal (8-28)       Assessment & Plan:     Welcome to Medicare Visit  Reviewed patient's Family Medical History Reviewed and updated list of patient's medical providers Assessment of cognitive impairment was done Assessed patient's functional ability  Established a written schedule for health screening [place] Completed and Reviewed  Exercise Activities and Dietary recommendations Goals   None     Immunization History  Administered Date(s) Administered  . Zoster 12/09/2014    Health Maintenance  Topic Date Due  . Hepatitis C Screening  May 02, 1951  . HIV Screening  08/06/1967  . TETANUS/TDAP  08/06/1971  . PNA vac Low Risk Adult (1 of 2 - PCV13) 08/05/2017  . INFLUENZA VACCINE  03/07/2018  . COLONOSCOPY  05/04/2018     Discussed health benefits of physical activity, and encouraged him to engage in regular exercise appropriate for his age and condition.  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  He will consider trying to  use this again.   ------------------------------------------------------------------------------------------------------------   I have done the exam and reviewed the above chart and it is accurate to the best of my knowledge. Development worker, community has been used in this note in any air is in the dictation or transcription are unintentional.  Wilhemena Durie, MD  [place]

## 2018-08-12 DIAGNOSIS — H2512 Age-related nuclear cataract, left eye: Secondary | ICD-10-CM | POA: Diagnosis not present

## 2018-08-20 ENCOUNTER — Telehealth: Payer: Self-pay | Admitting: *Deleted

## 2018-08-20 NOTE — Telephone Encounter (Signed)
Patient to want to see Dr. Bary Castilla . We will call back to reschedule.

## 2018-08-27 ENCOUNTER — Encounter: Payer: Self-pay | Admitting: Family Medicine

## 2018-08-27 ENCOUNTER — Ambulatory Visit (INDEPENDENT_AMBULATORY_CARE_PROVIDER_SITE_OTHER): Payer: Medicare Other | Admitting: Family Medicine

## 2018-08-27 ENCOUNTER — Ambulatory Visit: Payer: Self-pay | Admitting: General Surgery

## 2018-08-27 VITALS — BP 139/74 | HR 75 | Temp 98.2°F | Wt 173.4 lb

## 2018-08-27 DIAGNOSIS — R5383 Other fatigue: Secondary | ICD-10-CM

## 2018-08-27 DIAGNOSIS — M19212 Secondary osteoarthritis, left shoulder: Secondary | ICD-10-CM | POA: Diagnosis not present

## 2018-08-27 DIAGNOSIS — E7800 Pure hypercholesterolemia, unspecified: Secondary | ICD-10-CM

## 2018-08-27 DIAGNOSIS — Z125 Encounter for screening for malignant neoplasm of prostate: Secondary | ICD-10-CM

## 2018-08-27 DIAGNOSIS — K219 Gastro-esophageal reflux disease without esophagitis: Secondary | ICD-10-CM | POA: Diagnosis not present

## 2018-08-27 DIAGNOSIS — M19211 Secondary osteoarthritis, right shoulder: Secondary | ICD-10-CM

## 2018-08-27 DIAGNOSIS — R739 Hyperglycemia, unspecified: Secondary | ICD-10-CM

## 2018-08-27 NOTE — Progress Notes (Signed)
Patient: Jesse Church Male    DOB: 30-Nov-1950   68 y.o.   MRN: [id number] Visit Date: 08/27/2018  Today's Provider: Wilhemena Durie, MD   Chief Complaint  Patient presents with  . Hyperlipidemia   Subjective:    HPI  Lipid/Cholesterol, Follow-up:   Last seen for thismonths ago.  Management changes since that visit include none. . Last Lipid Panel:    Component Value Date/Time   CHOL 255 (H) 12/29/2016 0819   TRIG 72 12/29/2016 0819   HDL 68 12/29/2016 0819   CHOLHDL 3.8 12/29/2016 0819   LDLCALC 173 (H) 12/29/2016 0819    Risk factors for vascular disease include hypercholesterolemia  He reports good compliance with treatment. He is not having side effects.  Current symptoms include none and have been stable. Weight trend: stable Prior visit with dietician: no Current diet: well balanced Current exercise: none  Wt Readings from Last 3 Encounters:  08/27/18 173 lb 6.4 oz (78.7 kg)  06/26/18 171 lb (77.6 kg)  02/20/18 168 lb (76.2 kg)    -------------------------------------------------------------------    No Known Allergies   Current Outpatient Medications:  .  amoxicillin-clavulanate (AUGMENTIN) 875-125 MG tablet, Take 1 tablet by mouth 2 (two) times daily. (Patient not taking: Reported on 02/20/2018), Disp: 20 tablet, Rfl: 0 .  cyclopentolate (CYCLODRYL,CYCLOGYL) 1 % ophthalmic solution, Apply 1 drop to eye daily., Disp: , Rfl:   Review of Systems  Constitutional: Negative.   HENT: Negative.   Eyes: Negative.   Respiratory: Negative.   Gastrointestinal: Negative.   Endocrine: Negative.   Genitourinary: Negative.   Allergic/Immunologic: Negative.   Neurological: Negative.   Psychiatric/Behavioral: Negative.     Social History   Tobacco Use  . Smoking status: Never Smoker  . Smokeless tobacco: Never Used  Substance Use Topics  . Alcohol use: Yes    Alcohol/week: 7.0 standard drinks    Types: 7 Glasses of wine per week   Comment: 1 glass of wine with dinner      Objective:   BP 139/74 (BP Location: Right Arm, Patient Position: Sitting, Cuff Size: Normal)   Pulse 75   Temp 98.2 F (36.8 C) (Oral)   Wt 173 lb 6.4 oz (78.7 kg)   BMI 27.99 kg/m  Vitals:   08/27/18 1112  BP: 139/74  Pulse: 75  Temp: 98.2 F (36.8 C)  TempSrc: Oral  Weight: 173 lb 6.4 oz (78.7 kg)     Physical Exam Constitutional:      Appearance: He is well-developed.  HENT:     Head: Normocephalic and atraumatic.     Right Ear: External ear normal.     Left Ear: External ear normal.     Nose: Nose normal.  Eyes:     Conjunctiva/sclera: Conjunctivae normal.     Comments: Right pupil is irregular and dilated from recent trauma.  Has  been surgically repaired.  Neck:     Thyroid: No thyromegaly.  Cardiovascular:     Rate and Rhythm: Normal rate and regular rhythm.     Heart sounds: Normal heart sounds.  Pulmonary:     Effort: Pulmonary effort is normal.     Breath sounds: Normal breath sounds.  Abdominal:     Palpations: Abdomen is soft.  Musculoskeletal:     Comments: He has very decreased range of motion in both shoulders and has had for several years.  Lymphadenopathy:     Cervical: No cervical adenopathy.  Skin:  General: Skin is warm and dry.  Neurological:     Mental Status: He is alert and oriented to person, place, and time.  Psychiatric:        Mood and Affect: Mood normal.        Behavior: Behavior normal.        Thought Content: Thought content normal.        Judgment: Judgment normal.         Assessment & Plan    1. 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  2. Hyperglycemia   3. Pure hypercholesterolemia  - CBC with Differential/Platelet - Comprehensive metabolic panel - Lipid panel  4. Gastroesophageal reflux disease, esophagitis presence not specified   5. Other fatigue  - TSH  6. Screening for prostate cancer  - PSA    I  have done the exam and reviewed the above chart and it is accurate to the best of my knowledge. Development worker, community has been used in this note in any air is in the dictation or transcription are unintentional.  Wilhemena Durie, MD  [place]

## 2018-09-05 ENCOUNTER — Encounter: Payer: Self-pay | Admitting: *Deleted

## 2018-09-05 ENCOUNTER — Other Ambulatory Visit: Payer: Self-pay

## 2018-09-05 ENCOUNTER — Encounter: Payer: Self-pay | Admitting: General Surgery

## 2018-09-05 ENCOUNTER — Ambulatory Visit (INDEPENDENT_AMBULATORY_CARE_PROVIDER_SITE_OTHER): Payer: Medicare Other | Admitting: General Surgery

## 2018-09-05 VITALS — BP 166/76 | HR 100 | Resp 18 | Ht 66.0 in | Wt 170.4 lb

## 2018-09-05 DIAGNOSIS — Z1211 Encounter for screening for malignant neoplasm of colon: Secondary | ICD-10-CM | POA: Diagnosis not present

## 2018-09-05 NOTE — Patient Instructions (Signed)
Colonoscopy, Adult A colonoscopy is an exam to look at the entire large intestine. During the exam, a lubricated, flexible tube that has a camera on the end of it is inserted into the anus and then passed into the rectum, colon, and other parts of the large intestine. You may have a colonoscopy as a part of normal colorectal screening or if you have certain symptoms, such as:  Lack of red blood cells (anemia).  Diarrhea that does not go away.  Abdominal pain.  Blood in your stool (feces). A colonoscopy can help screen for and diagnose medical problems, including:  Tumors.  Polyps.  Inflammation.  Areas of bleeding. Tell a health care provider about:  Any allergies you have.  All medicines you are taking, including vitamins, herbs, eye drops, creams, and over-the-counter medicines.  Any problems you or family members have had with anesthetic medicines.  Any blood disorders you have.  Any surgeries you have had.  Any medical conditions you have.  Any problems you have had passing stool. What are the risks? Generally, this is a safe procedure. However, problems may occur, including:  Bleeding.  A tear in the intestine.  A reaction to medicines given during the exam.  Infection (rare). What happens before the procedure? Eating and drinking restrictions Follow instructions from your health care provider about eating and drinking, which may include:  A few days before the procedure - follow a low-fiber diet. Avoid nuts, seeds, dried fruit, raw fruits, and vegetables.  1-3 days before the procedure - follow a clear liquid diet. Drink only clear liquids, such as clear broth or bouillon, black coffee or tea, clear juice, clear soft drinks or sports drinks, gelatin dessert, and popsicles. Avoid any liquids that contain red or purple dye.  On the day of the procedure - do not eat or drink anything starting 2 hours before the procedure, or within the time period that your  health care provider recommends. Up to 2 hours before the procedure, you may continue to drink clear liquids, such as water or clear fruit juice. Bowel prep If you were prescribed an oral bowel prep to clean out your colon:  Take it as told by your health care provider. Starting the day before your procedure, you will need to drink a large amount of medicated liquid. The liquid will cause you to have multiple loose stools until your stool is almost clear or light green.  If your skin or anus gets irritated from diarrhea, you may use these to relieve the irritation: ? Medicated wipes, such as adult wet wipes with aloe and vitamin E. ? A skin-soothing product like petroleum jelly.  If you vomit while drinking the bowel prep, take a break for up to 60 minutes and then begin the bowel prep again. If vomiting continues and you cannot take the bowel prep without vomiting, call your health care provider.  To clean out your colon, you may also be given: ? Laxative medicines. ? Instructions about how to use an enema. General instructions  Ask your health care provider about: ? Changing or stopping your regular medicines or supplements. This is especially important if you are taking iron supplements, diabetes medicines, or blood thinners. ? Taking medicines such as aspirin and ibuprofen. These medicines can thin your blood. Do not take these medicines before the procedure if your health care provider tells you not to.  Plan to have someone take you home from the hospital or clinic. What happens during the procedure?     An IV may be inserted into one of your veins.  You will be given medicine to help you relax (sedative).  To reduce your risk of infection: ? Your health care team will wash or sanitize their hands. ? Your anal area will be washed with soap.  You will be asked to lie on your side with your knees bent.  Your health care provider will lubricate a long, thin, flexible tube. The  tube will have a camera and a light on the end.  The tube will be inserted into your anus.  The tube will be gently eased through your rectum and colon.  Air will be delivered into your colon to keep it open. You may feel some pressure or cramping.  The camera will be used to take images during the procedure.  A small tissue sample may be removed to be examined under a microscope (biopsy).  If small polyps are found, your health care provider may remove them and have them checked for cancer cells.  When the exam is done, the tube will be removed. The procedure may vary among health care providers and hospitals. What happens after the procedure?  Your blood pressure, heart rate, breathing rate, and blood oxygen level will be monitored until the medicines you were given have worn off.  Do not drive for 24 hours after the exam.  You may have a small amount of blood in your stool.  You may pass gas and have mild abdominal cramping or bloating due to the air that was used to inflate your colon during the exam.  It is up to you to get the results of your procedure. Ask your health care provider, or the department performing the procedure, when your results will be ready. Summary  A colonoscopy is an exam to look at the entire large intestine.  During a colonoscopy, a lubricated, flexible tube with a camera on the end of it is inserted into the anus and then passed into the colon and other parts of the large intestine.  Follow instructions from your health care provider about eating and drinking before the procedure.  If you were prescribed an oral bowel prep to clean out your colon, take it as told by your health care provider.  After your procedure, your blood pressure, heart rate, breathing rate, and blood oxygen level will be monitored until the medicines you were given have worn off. This information is not intended to replace advice given to you by your health care provider. Make  sure you discuss any questions you have with your health care provider. Document Released: 07/21/2000 Document Revised: 05/16/2017 Document Reviewed: 10/05/2015 Elsevier Interactive Patient Education  2019 Elsevier Inc.  

## 2018-09-05 NOTE — Progress Notes (Signed)
Patient ID: Jesse Church, male   DOB: 09/03/1950, 68 y.o.   MRN: [id number]  Chief Complaint  Patient presents with  . Colonoscopy    val colonoscopy last one done by Dr Jesse Church 10 years ago    HPI Jesse Church is a 68 y.o. male.  Who presents for a colonoscopy discussion. The last colonoscopy was completed in 2009. Denies any gastrointestinal issues. Bowels move regular and are loose, bleeding is noted 2-3 times a year on tissue.  The patient reports he has multiple loose stools daily, up to 5-6/day.  Baseline. The patient's oldest daughter was recently diagnosed with a granular cell tumor of the ovary and is starting chemotherapy at [facility].  HPI  No past medical history on file.  Past Surgical History:  Procedure Laterality Date  . APPENDECTOMY  1992  . COLONOSCOPY  2009  . IMPLANTABLE CONTACT LENS IMPLANTATION Right 2019  . JOINT REPLACEMENT Right 2019   partial  . KNEE SURGERY  1969  . SHOULDER SURGERY Right 2011  . TONSILLECTOMY AND ADENOIDECTOMY      Family History  Problem Relation Age of Onset  . Hepatitis Mother   . Dementia Father   . Healthy Sister   . Dementia Paternal Grandmother   . Colon cancer Neg Hx     Social History Social History   Tobacco Use  . Smoking status: Never Smoker  . Smokeless tobacco: Never Used  Substance Use Topics  . Alcohol use: Yes    Alcohol/week: 7.0 standard drinks    Types: 7 Glasses of wine per week    Comment: 1 glass of wine with dinner  . Drug use: No    No Known Allergies  No current outpatient medications on file.   No current facility-administered medications for this visit.     Review of Systems Review of Systems  Constitutional: Negative.   Respiratory: Negative.   Cardiovascular: Negative.   Gastrointestinal: Negative for constipation and diarrhea.    Blood pressure (!) 166/76, pulse 100, resp. rate 18, height 5\' 6"  (1.676 m), weight 170 lb 6.4 oz (77.3 kg), SpO2 95 %.  Physical  Exam Physical Exam Vitals signs reviewed.  Constitutional:      Appearance: Normal appearance.  Neck:     Musculoskeletal: Normal range of motion and neck supple.  Cardiovascular:     Rate and Rhythm: Normal rate and regular rhythm.  Pulmonary:     Effort: Pulmonary effort is normal.     Breath sounds: Normal breath sounds.  Neurological:     Mental Status: He is alert.     Data Reviewed Colonoscopy of May 04, 2008 reviewed.  A few small mouth diverticuli noted in the sigmoid colon.  Small internal hemorrhoids.  No bleeding.  Assessment    Candidate for screening colonoscopy versus Cologuard testing.    Plan    Pros and cons of colonoscopy/Cologuard reviewed.  At this time the patient is interested in pursuing with colonoscopy.  Colonoscopy with possible biopsy/polypectomy prn: Information regarding the procedure, including its potential risks and complications (including but not limited to perforation of the bowel, which may require emergency surgery to repair, and bleeding) was verbally given to the patient. Educational information regarding lower intestinal endoscopy was given to the patient. Written instructions for how to complete the bowel prep using Miralax were provided. The importance of drinking ample fluids to avoid dehydration as a result of the prep emphasized.    HPI, Physical Exam, Assessment  and Plan have been scribed under the direction and in the presence of Jesse Bellow, MD. Jesse Fetch, RN  I have completed the exam and reviewed the above documentation for accuracy and completeness.  I agree with the above.  Haematologist has been used and any errors in dictation or transcription are unintentional.  Jesse Church, M.D., F.A.C.S. Jesse Church Jesse Church 09/05/2018, 10:19 AM

## 2018-09-05 NOTE — Progress Notes (Signed)
Patient wishes to call the office when he is ready to schedule colonoscopy.   The patient states it may be a few months before he get this scheduled but he will call.

## 2018-09-17 DIAGNOSIS — E7800 Pure hypercholesterolemia, unspecified: Secondary | ICD-10-CM | POA: Diagnosis not present

## 2018-09-17 DIAGNOSIS — R5383 Other fatigue: Secondary | ICD-10-CM | POA: Diagnosis not present

## 2018-09-17 DIAGNOSIS — Z125 Encounter for screening for malignant neoplasm of prostate: Secondary | ICD-10-CM | POA: Diagnosis not present

## 2018-09-19 LAB — CBC WITH DIFFERENTIAL/PLATELET
BASOS: 1 %
Basophils Absolute: 0.1 10*3/uL (ref 0.0–0.2)
EOS (ABSOLUTE): 0.1 10*3/uL (ref 0.0–0.4)
Eos: 2 %
Hematocrit: 46.1 % (ref 37.5–51.0)
Hemoglobin: 15.8 g/dL (ref 13.0–17.7)
Immature Grans (Abs): 0 10*3/uL (ref 0.0–0.1)
Immature Granulocytes: 0 %
Lymphocytes Absolute: 2.3 10*3/uL (ref 0.7–3.1)
Lymphs: 36 %
MCH: 28.7 pg (ref 26.6–33.0)
MCHC: 34.3 g/dL (ref 31.5–35.7)
MCV: 84 fL (ref 79–97)
MONOS ABS: 0.7 10*3/uL (ref 0.1–0.9)
Monocytes: 11 %
NEUTROS ABS: 3.2 10*3/uL (ref 1.4–7.0)
Neutrophils: 50 %
PLATELETS: 274 10*3/uL (ref 150–450)
RBC: 5.5 x10E6/uL (ref 4.14–5.80)
RDW: 14.2 % (ref 11.6–15.4)
WBC: 6.5 10*3/uL (ref 3.4–10.8)

## 2018-09-19 LAB — COMPREHENSIVE METABOLIC PANEL
A/G RATIO: 1.9 (ref 1.2–2.2)
ALT: 26 IU/L (ref 0–44)
AST: 18 IU/L (ref 0–40)
Albumin: 4.6 g/dL (ref 3.8–4.8)
Alkaline Phosphatase: 69 IU/L (ref 39–117)
BILIRUBIN TOTAL: 0.5 mg/dL (ref 0.0–1.2)
BUN/Creatinine Ratio: 15 (ref 10–24)
BUN: 14 mg/dL (ref 8–27)
CHLORIDE: 102 mmol/L (ref 96–106)
CO2: 24 mmol/L (ref 20–29)
Calcium: 9.6 mg/dL (ref 8.6–10.2)
Creatinine, Ser: 0.96 mg/dL (ref 0.76–1.27)
GFR calc Af Amer: 95 mL/min/{1.73_m2} (ref >59)
GFR calc non Af Amer: 82 mL/min/{1.73_m2} (ref >59)
GLOBULIN, TOTAL: 2.4 g/dL (ref 1.5–4.5)
Glucose: 102 mg/dL — ABNORMAL HIGH (ref 65–99)
POTASSIUM: 4.4 mmol/L (ref 3.5–5.2)
SODIUM: 142 mmol/L (ref 134–144)
TOTAL PROTEIN: 7 g/dL (ref 6.0–8.5)

## 2018-09-19 LAB — LIPID PANEL
Chol/HDL Ratio: 3.6 ratio (ref 0.0–5.0)
Cholesterol, Total: 249 mg/dL — ABNORMAL HIGH (ref 100–199)
HDL: 70 mg/dL (ref >39)
LDL Calculated: 159 mg/dL — ABNORMAL HIGH (ref 0–99)
Triglycerides: 100 mg/dL (ref 0–149)
VLDL Cholesterol Cal: 20 mg/dL (ref 5–40)

## 2018-09-19 LAB — PSA: Prostate Specific Ag, Serum: 4.7 ng/mL — ABNORMAL HIGH (ref 0.0–4.0)

## 2018-09-19 LAB — TSH: TSH: 1.93 u[IU]/mL (ref 0.450–4.500)

## 2018-09-24 ENCOUNTER — Encounter: Payer: Self-pay | Admitting: General Surgery

## 2018-11-27 DIAGNOSIS — R972 Elevated prostate specific antigen [PSA]: Secondary | ICD-10-CM | POA: Diagnosis not present

## 2018-11-27 DIAGNOSIS — R3121 Asymptomatic microscopic hematuria: Secondary | ICD-10-CM | POA: Diagnosis not present

## 2018-12-02 ENCOUNTER — Other Ambulatory Visit: Payer: Self-pay | Admitting: Urology

## 2018-12-02 DIAGNOSIS — R972 Elevated prostate specific antigen [PSA]: Secondary | ICD-10-CM

## 2018-12-16 ENCOUNTER — Other Ambulatory Visit: Payer: Self-pay

## 2018-12-26 DIAGNOSIS — R972 Elevated prostate specific antigen [PSA]: Secondary | ICD-10-CM | POA: Diagnosis not present

## 2019-01-09 ENCOUNTER — Other Ambulatory Visit: Payer: Self-pay

## 2019-01-09 ENCOUNTER — Ambulatory Visit
Admission: RE | Admit: 2019-01-09 | Discharge: 2019-01-09 | Disposition: A | Discharge status: Home / Self Care | Payer: Medicare Other | Source: Ambulatory Visit | Attending: Urology | Admitting: Urology

## 2019-01-09 DIAGNOSIS — R972 Elevated prostate specific antigen [PSA]: Secondary | ICD-10-CM

## 2019-01-09 MED ORDER — GADOBENATE DIMEGLUMINE 529 MG/ML IV SOLN
15.0000 mL | Freq: Once | INTRAVENOUS | Status: AC | PRN
  Administered 2019-01-09: 15 mL via INTRAVENOUS

## 2019-02-17 DIAGNOSIS — Z96651 Presence of right artificial knee joint: Secondary | ICD-10-CM | POA: Diagnosis not present

## 2019-02-17 DIAGNOSIS — M87051 Idiopathic aseptic necrosis of right femur: Secondary | ICD-10-CM | POA: Diagnosis not present

## 2019-02-17 DIAGNOSIS — Z471 Aftercare following joint replacement surgery: Secondary | ICD-10-CM | POA: Diagnosis not present

## 2019-03-05 ENCOUNTER — Ambulatory Visit (INDEPENDENT_AMBULATORY_CARE_PROVIDER_SITE_OTHER): Payer: Medicare Other | Admitting: Family Medicine

## 2019-03-05 ENCOUNTER — Encounter: Payer: Self-pay | Admitting: Family Medicine

## 2019-03-05 ENCOUNTER — Other Ambulatory Visit: Payer: Self-pay

## 2019-03-05 VITALS — BP 161/75 | HR 85 | Temp 98.2°F | Resp 18 | Wt 170.4 lb

## 2019-03-05 DIAGNOSIS — G4733 Obstructive sleep apnea (adult) (pediatric): Secondary | ICD-10-CM | POA: Diagnosis not present

## 2019-03-05 DIAGNOSIS — Z23 Encounter for immunization: Secondary | ICD-10-CM

## 2019-03-05 DIAGNOSIS — R972 Elevated prostate specific antigen [PSA]: Secondary | ICD-10-CM | POA: Diagnosis not present

## 2019-03-05 DIAGNOSIS — M19212 Secondary osteoarthritis, left shoulder: Secondary | ICD-10-CM | POA: Diagnosis not present

## 2019-03-05 NOTE — Progress Notes (Signed)
Patient: Jesse Church Male    DOB: 07-13-1951   68 y.o.   MRN: [id number] Visit Date: 03/05/2019  Today's Provider: Wilhemena Durie, MD   Chief Complaint  Patient presents with  . Follow-up   Subjective:     HPI  Follow up  His chronic shoulder pain and limitation of range of motion is slowly getting worse. He is using CPAP nightly. Elevated PSA followed by Dr Alinda Money. F/u colonoscopy per Dr Bary Castilla. BP at home 120-138/70s to 80s.  He and his wife now have 44 GC.  No Known Allergies  No current outpatient medications on file.  Review of Systems  Constitutional: Negative.   HENT: Negative.   Eyes: Negative.   Respiratory: Negative.   Gastrointestinal: Negative.   Endocrine: Negative.   Genitourinary: Negative.   Musculoskeletal: Positive for arthralgias.  Skin: Negative.   Allergic/Immunologic: Negative.   Neurological: Negative.   Psychiatric/Behavioral: Negative.   All other systems reviewed and are negative.   Social History   Tobacco Use  . Smoking status: Never Smoker  . Smokeless tobacco: Never Used  Substance Use Topics  . Alcohol use: Yes    Alcohol/week: 7.0 standard drinks    Types: 7 Glasses of wine per week    Comment: 1 glass of wine with dinner      Objective:   BP (!) 161/75 (BP Location: Right Arm, Patient Position: Sitting, Cuff Size: Normal)   Pulse 85   Temp 98.2 F (36.8 C) (Oral)   Resp 18   Wt 170 lb 6.4 oz (77.3 kg)   SpO2 97%   BMI 27.50 kg/m  Vitals:   03/05/19 1053  BP: (!) 161/75  Pulse: 85  Resp: 18  Temp: 98.2 F (36.8 C)  TempSrc: Oral  SpO2: 97%  Weight: 170 lb 6.4 oz (77.3 kg)     Physical Exam Vitals signs reviewed.  Constitutional:      Appearance: He is well-developed.  HENT:     Head: Normocephalic and atraumatic.     Right Ear: External ear normal.     Left Ear: External ear normal.     Nose: Nose normal.  Eyes:     Conjunctiva/sclera: Conjunctivae normal.     Comments: Right  pupil is irregular and dilated from recent trauma.  Has  been surgically repaired.  Neck:     Thyroid: No thyromegaly.  Cardiovascular:     Rate and Rhythm: Normal rate and regular rhythm.     Heart sounds: Normal heart sounds.  Pulmonary:     Effort: Pulmonary effort is normal.     Breath sounds: Normal breath sounds.  Abdominal:     Palpations: Abdomen is soft.  Musculoskeletal:     Comments: He has very decreased range of motion in both shoulders and has had for several years.  Lymphadenopathy:     Cervical: No cervical adenopathy.  Skin:    General: Skin is warm and dry.  Neurological:     Mental Status: He is alert and oriented to person, place, and time.  Psychiatric:        Mood and Affect: Mood normal.        Behavior: Behavior normal.        Thought Content: Thought content normal.        Judgment: Judgment normal.      No results found for any visits on 03/05/19.     Assessment & Plan    1.  Need for zoster vaccine  - Varicella-zoster vaccine IM (Shingrix)  2. Secondary osteoarthritis of left shoulder due to rotator cuff arthropathy This is slowly getting worse. Pt unable to work as Software engineer.  3. OSA (obstructive sleep apnea) Nightly CPAP  4. Elevated PSA Dr Cloretta Ned Cranford Mon, MD  [facility]

## 2019-03-14 DIAGNOSIS — M19212 Secondary osteoarthritis, left shoulder: Secondary | ICD-10-CM | POA: Insufficient documentation

## 2019-05-14 ENCOUNTER — Ambulatory Visit (INDEPENDENT_AMBULATORY_CARE_PROVIDER_SITE_OTHER): Payer: Medicare Other

## 2019-05-14 ENCOUNTER — Other Ambulatory Visit: Payer: Self-pay

## 2019-05-14 DIAGNOSIS — Z23 Encounter for immunization: Secondary | ICD-10-CM | POA: Diagnosis not present

## 2019-07-08 DIAGNOSIS — U071 COVID-19: Secondary | ICD-10-CM

## 2019-07-08 HISTORY — DX: COVID-19: U07.1

## 2019-07-09 DIAGNOSIS — Z23 Encounter for immunization: Secondary | ICD-10-CM | POA: Insufficient documentation

## 2019-07-09 DIAGNOSIS — J069 Acute upper respiratory infection, unspecified: Secondary | ICD-10-CM | POA: Insufficient documentation

## 2019-07-09 DIAGNOSIS — B9689 Other specified bacterial agents as the cause of diseases classified elsewhere: Secondary | ICD-10-CM | POA: Insufficient documentation

## 2019-07-10 ENCOUNTER — Ambulatory Visit (INDEPENDENT_AMBULATORY_CARE_PROVIDER_SITE_OTHER): Payer: Medicare Other | Admitting: Family Medicine

## 2019-07-10 ENCOUNTER — Other Ambulatory Visit: Payer: Self-pay

## 2019-07-10 DIAGNOSIS — Z23 Encounter for immunization: Secondary | ICD-10-CM

## 2019-07-10 NOTE — Progress Notes (Signed)
Vaccine only

## 2019-07-16 DIAGNOSIS — R972 Elevated prostate specific antigen [PSA]: Secondary | ICD-10-CM | POA: Diagnosis not present

## 2019-07-21 ENCOUNTER — Encounter: Payer: Self-pay | Admitting: Family Medicine

## 2019-07-28 ENCOUNTER — Encounter: Payer: Self-pay | Admitting: Family Medicine

## 2019-07-28 MED ORDER — PREDNISONE 20 MG PO TABS: 20.0000 mg | ORAL_TABLET | Freq: Two times a day (BID) | ORAL | 0 refills | Status: AC

## 2019-08-27 ENCOUNTER — Telehealth: Payer: Self-pay

## 2019-08-27 DIAGNOSIS — R972 Elevated prostate specific antigen [PSA]: Secondary | ICD-10-CM | POA: Diagnosis not present

## 2019-08-27 NOTE — Telephone Encounter (Signed)
Please advise 

## 2019-08-27 NOTE — Telephone Encounter (Signed)
Copied from [place] [phone]. Topic: General - Inquiry >> Aug 27, 2019  9:32 AM Jesse Church, NT wrote: Reason for CRM: Pt called in stating he would like to have the covid-19 antibody test done. Please advise when order is placed.

## 2019-09-01 ENCOUNTER — Other Ambulatory Visit: Payer: Self-pay

## 2019-09-01 DIAGNOSIS — U071 COVID-19: Secondary | ICD-10-CM

## 2019-09-01 NOTE — Telephone Encounter (Signed)
Please call patient and advise that labs have been ordered, will send lab slip next door

## 2019-09-01 NOTE — Telephone Encounter (Signed)
Patient advised.

## 2019-09-02 DIAGNOSIS — U071 COVID-19: Secondary | ICD-10-CM | POA: Diagnosis not present

## 2019-09-02 DIAGNOSIS — H2512 Age-related nuclear cataract, left eye: Secondary | ICD-10-CM | POA: Diagnosis not present

## 2019-09-03 LAB — SARS-COV-2 ANTIBODIES: SARS-CoV-2 Antibodies: POSITIVE — AB

## 2019-09-19 DIAGNOSIS — Z23 Encounter for immunization: Secondary | ICD-10-CM | POA: Diagnosis not present

## 2019-10-10 DIAGNOSIS — Z23 Encounter for immunization: Secondary | ICD-10-CM | POA: Diagnosis not present

## 2019-10-16 ENCOUNTER — Other Ambulatory Visit: Payer: Self-pay | Admitting: Urology

## 2019-10-16 ENCOUNTER — Other Ambulatory Visit (HOSPITAL_COMMUNITY): Payer: Self-pay | Admitting: Urology

## 2019-10-16 DIAGNOSIS — R972 Elevated prostate specific antigen [PSA]: Secondary | ICD-10-CM

## 2019-11-10 ENCOUNTER — Encounter (HOSPITAL_BASED_OUTPATIENT_CLINIC_OR_DEPARTMENT_OTHER): Payer: Self-pay | Admitting: Urology

## 2019-11-10 ENCOUNTER — Other Ambulatory Visit: Payer: Self-pay

## 2019-11-10 NOTE — Progress Notes (Signed)
Spoke w/ via phone for pre-op interview---patient Lab needs dos----   none            COVID test ------11-13-2019 1000 at [facility] at -------900 am 11-17-2019 NPO after ------midnight Medications to take morning of surgery -----none Diabetic medication -----n/a Patient Special Instructions -----fleets enema am of surgery Pre-Op special Istructions -----none Patient verbalized understanding of instructions that were given at this phone interview. Patient denies shortness of breath, chest pain, fever, cough a this phone interview.

## 2019-11-13 ENCOUNTER — Other Ambulatory Visit
Admission: RE | Admit: 2019-11-13 | Discharge: 2019-11-13 | Disposition: A | Discharge status: Home / Self Care | Payer: Medicare Other | Source: Ambulatory Visit | Attending: Urology | Admitting: Urology

## 2019-11-13 ENCOUNTER — Other Ambulatory Visit: Payer: Self-pay

## 2019-11-13 DIAGNOSIS — Z01812 Encounter for preprocedural laboratory examination: Secondary | ICD-10-CM | POA: Diagnosis not present

## 2019-11-13 DIAGNOSIS — Z20822 Contact with and (suspected) exposure to covid-19: Secondary | ICD-10-CM | POA: Insufficient documentation

## 2019-11-13 LAB — SARS CORONAVIRUS 2 (TAT 6-24 HRS): SARS Coronavirus 2: NEGATIVE

## 2019-11-14 NOTE — H&P (Signed)
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 At his initial visit with me last spring, we repeated his PSA which was 3.81. He ultimately underwent an MRI of the prostate in June that did not demonstrate any suspicious lesions. Based on that information, we elected to continue with surveillance monitoring of his PSA. He returns today and states that he has had no significant changes in his overall health. He denies any new voiding symptoms.     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 He was noted to have a rising PSA over the past few years and increased to 3.81 in April 2020. He underwent an MRI of the prostate in June 2020 which was unremarkable and he elected to continue to monitor his PSA.   He has no family history of prostate cancer.   Apr 2020: PSA 3.81  Jun 2020: MRI - unremarkable   2) Hematuria: He has a long history of microscopic hematuria starting as a teenager. He underwent an IVP that was negative. He denies tobacco use. He has no family history of GU malignancy.   NON-GU PMH: Arthritis GERD Hypercholesterolemia Sleep Apnea    FAMILY HISTORY: liver disease - Mother    Notes: 1 son, 2 daughters   SOCIAL HISTORY: Marital Status: Married Preferred Language: English; Ethnicity: Not Hispanic Or Latino; Race: White Current Smoking Status: Patient has never smoked.   Tobacco Use Assessment  Completed: Used Tobacco in last 30 days? Does not use smokeless tobacco. Drinks 1 drink per day. Types of alcohol consumed: Wine.  Does not use drugs. Drinks 1 caffeinated drink per day. Has not had a blood transfusion.    REVIEW OF SYSTEMS:    GU Review Male:   Patient reports get up at night to urinate. Patient denies frequent urination, hard to postpone urination, burning/ pain with urination, leakage of urine, stream starts and stops, trouble starting your streams, and have to strain to urinate .  Gastrointestinal (Lower):   Patient denies diarrhea and constipation.  Gastrointestinal (Upper):   Patient denies nausea and vomiting.  Constitutional:   Patient denies fever, night sweats, weight loss, and fatigue.  Skin:   Patient denies skin rash/ lesion and itching.  Eyes:   Patient denies blurred vision and double vision.  Ears/ Nose/ Throat:   Patient denies sore throat and sinus problems.  Hematologic/Lymphatic:   Patient denies swollen glands and easy bruising.  Cardiovascular:   Patient denies leg swelling and chest pains.  Respiratory:   Patient denies cough and shortness of breath.  Endocrine:   Patient denies excessive thirst.  Musculoskeletal:   Patient denies back pain and joint pain.  Neurological:   Patient denies headaches and dizziness.  Psychologic:   Patient denies depression and anxiety.   VITAL SIGNS:      Weight 158 lb / 71.67 kg  Height 66 in / 167.64 cm  BMI 25.5 kg/m    MULTI-SYSTEM PHYSICAL EXAMINATION:    Constitutional: Well-nourished. No physical deformities. Normally developed. Good grooming.  CV: RRR Lungs: Clear    PAST DATA REVIEWED:  Source Of History:  Patient  Lab Test Review:   PSA  Records Review:   Previous Patient Records  Urine Test Review:   Urinalysis  X-Ray Review: MRI Prostate GSORAD: Reviewed Films.     07/17/19 11/27/18  PSA  Total PSA 4.25 ng/mL 3.81 ng/mL  Free PSA 0.62 ng/mL   % Free PSA 15 % PSA     ASSESSMENT:       ICD-10 Details  1 GU:   Elevated PSA - R97.20    PLAN:      1. Elevated PSA: He will proceed with a prostate biopsy.  Due to his inability to tolerate a biopsy in the office, this will be performed in the operating room.

## 2019-11-17 ENCOUNTER — Encounter (HOSPITAL_BASED_OUTPATIENT_CLINIC_OR_DEPARTMENT_OTHER)
Admission: RE | Disposition: A | Discharge status: Home / Self Care | Payer: Self-pay | Source: Home / Self Care | Attending: Urology

## 2019-11-17 ENCOUNTER — Ambulatory Visit (HOSPITAL_BASED_OUTPATIENT_CLINIC_OR_DEPARTMENT_OTHER): Payer: Medicare Other | Admitting: Anesthesiology

## 2019-11-17 ENCOUNTER — Ambulatory Visit (HOSPITAL_COMMUNITY)
Admission: RE | Admit: 2019-11-17 | Discharge: 2019-11-17 | Disposition: A | Discharge status: Home / Self Care | Payer: Medicare Other | Source: Ambulatory Visit | Attending: Urology | Admitting: Urology

## 2019-11-17 ENCOUNTER — Encounter (HOSPITAL_BASED_OUTPATIENT_CLINIC_OR_DEPARTMENT_OTHER): Payer: Self-pay | Admitting: Urology

## 2019-11-17 ENCOUNTER — Ambulatory Visit (HOSPITAL_BASED_OUTPATIENT_CLINIC_OR_DEPARTMENT_OTHER)
Admission: RE | Admit: 2019-11-17 | Discharge: 2019-11-17 | Disposition: A | Discharge status: Home / Self Care | Payer: Medicare Other | Attending: Urology | Admitting: Urology

## 2019-11-17 DIAGNOSIS — R972 Elevated prostate specific antigen [PSA]: Secondary | ICD-10-CM

## 2019-11-17 DIAGNOSIS — R3129 Other microscopic hematuria: Secondary | ICD-10-CM | POA: Insufficient documentation

## 2019-11-17 DIAGNOSIS — D291 Benign neoplasm of prostate: Secondary | ICD-10-CM | POA: Insufficient documentation

## 2019-11-17 DIAGNOSIS — J45909 Unspecified asthma, uncomplicated: Secondary | ICD-10-CM | POA: Diagnosis not present

## 2019-11-17 DIAGNOSIS — G4730 Sleep apnea, unspecified: Secondary | ICD-10-CM | POA: Diagnosis not present

## 2019-11-17 DIAGNOSIS — E7800 Pure hypercholesterolemia, unspecified: Secondary | ICD-10-CM | POA: Insufficient documentation

## 2019-11-17 DIAGNOSIS — M1990 Unspecified osteoarthritis, unspecified site: Secondary | ICD-10-CM | POA: Diagnosis not present

## 2019-11-17 DIAGNOSIS — K449 Diaphragmatic hernia without obstruction or gangrene: Secondary | ICD-10-CM | POA: Insufficient documentation

## 2019-11-17 DIAGNOSIS — K219 Gastro-esophageal reflux disease without esophagitis: Secondary | ICD-10-CM | POA: Diagnosis not present

## 2019-11-17 HISTORY — PX: PROSTATE BIOPSY: SHX241

## 2019-11-17 HISTORY — PX: CYSTOSCOPY: SHX5120

## 2019-11-17 HISTORY — DX: Gastro-esophageal reflux disease without esophagitis: K21.9

## 2019-11-17 HISTORY — DX: Sleep apnea, unspecified: G47.30

## 2019-11-17 HISTORY — DX: Personal history of other diseases of the digestive system: Z87.19

## 2019-11-17 HISTORY — DX: Unspecified osteoarthritis, unspecified site: M19.90

## 2019-11-17 SURGERY — CYSTOSCOPY, FLEXIBLE
Anesthesia: General | Site: Prostate

## 2019-11-17 MED ORDER — FLEET ENEMA 7-19 GM/118ML RE ENEM
1.0000 | ENEMA | Freq: Once | RECTAL | Status: DC
  Filled 2019-11-17: qty 1

## 2019-11-17 MED ORDER — SODIUM CHLORIDE 0.9 % IV SOLN
INTRAVENOUS | Status: DC
  Filled 2019-11-17: qty 1000

## 2019-11-17 MED ORDER — CELECOXIB 200 MG PO CAPS
ORAL_CAPSULE | ORAL | Status: AC
  Filled 2019-11-17: qty 1

## 2019-11-17 MED ORDER — DEXAMETHASONE SODIUM PHOSPHATE 10 MG/ML IJ SOLN
INTRAMUSCULAR | Status: DC | PRN
  Administered 2019-11-17: 5 mg via INTRAVENOUS

## 2019-11-17 MED ORDER — CEFTRIAXONE SODIUM 2 G IJ SOLR
INTRAMUSCULAR | Status: AC
  Filled 2019-11-17: qty 20

## 2019-11-17 MED ORDER — SUCCINYLCHOLINE CHLORIDE 200 MG/10ML IV SOSY
PREFILLED_SYRINGE | INTRAVENOUS | Status: AC
  Filled 2019-11-17: qty 10

## 2019-11-17 MED ORDER — ROCURONIUM BROMIDE 10 MG/ML (PF) SYRINGE
PREFILLED_SYRINGE | INTRAVENOUS | Status: AC
  Filled 2019-11-17: qty 10

## 2019-11-17 MED ORDER — SODIUM CHLORIDE 0.9 % IR SOLN
Status: DC | PRN
  Administered 2019-11-17: 200 mL via INTRAVESICAL

## 2019-11-17 MED ORDER — DEXAMETHASONE SODIUM PHOSPHATE 10 MG/ML IJ SOLN
INTRAMUSCULAR | Status: AC
  Filled 2019-11-17: qty 1

## 2019-11-17 MED ORDER — PHENYLEPHRINE 40 MCG/ML (10ML) SYRINGE FOR IV PUSH (FOR BLOOD PRESSURE SUPPORT)
PREFILLED_SYRINGE | INTRAVENOUS | Status: DC | PRN
  Administered 2019-11-17 (×2): 160 ug via INTRAVENOUS

## 2019-11-17 MED ORDER — LIDOCAINE 2% (20 MG/ML) 5 ML SYRINGE
INTRAMUSCULAR | Status: DC | PRN
  Administered 2019-11-17: 60 mg via INTRAVENOUS

## 2019-11-17 MED ORDER — ONDANSETRON HCL 4 MG/2ML IJ SOLN
INTRAMUSCULAR | Status: DC | PRN
  Administered 2019-11-17: 4 mg via INTRAVENOUS

## 2019-11-17 MED ORDER — PROMETHAZINE HCL 25 MG/ML IJ SOLN
6.2500 mg | INTRAMUSCULAR | Status: DC | PRN
  Filled 2019-11-17: qty 1

## 2019-11-17 MED ORDER — CELECOXIB 200 MG PO CAPS
200.0000 mg | ORAL_CAPSULE | Freq: Once | ORAL | Status: AC
  Administered 2019-11-17: 09:00:00 200 mg via ORAL
  Filled 2019-11-17: qty 1

## 2019-11-17 MED ORDER — SODIUM CHLORIDE 0.9 % IV SOLN
INTRAVENOUS | Status: AC
  Filled 2019-11-17: qty 100

## 2019-11-17 MED ORDER — MIDAZOLAM HCL 2 MG/2ML IJ SOLN
INTRAMUSCULAR | Status: DC | PRN
  Administered 2019-11-17: 2 mg via INTRAVENOUS

## 2019-11-17 MED ORDER — SODIUM CHLORIDE 0.9 % IV SOLN
2.0000 g | Freq: Once | INTRAVENOUS | Status: AC
  Administered 2019-11-17: 10:00:00 2 g via INTRAVENOUS
  Filled 2019-11-17: qty 20

## 2019-11-17 MED ORDER — ONDANSETRON HCL 4 MG/2ML IJ SOLN
INTRAMUSCULAR | Status: AC
  Filled 2019-11-17: qty 2

## 2019-11-17 MED ORDER — LIDOCAINE 2% (20 MG/ML) 5 ML SYRINGE
INTRAMUSCULAR | Status: AC
  Filled 2019-11-17: qty 5

## 2019-11-17 MED ORDER — ACETAMINOPHEN 500 MG PO TABS
ORAL_TABLET | ORAL | Status: AC
  Filled 2019-11-17: qty 2

## 2019-11-17 MED ORDER — PROPOFOL 10 MG/ML IV BOLUS
INTRAVENOUS | Status: DC | PRN
  Administered 2019-11-17: 150 mg via INTRAVENOUS

## 2019-11-17 MED ORDER — MIDAZOLAM HCL 2 MG/2ML IJ SOLN
INTRAMUSCULAR | Status: AC
  Filled 2019-11-17: qty 2

## 2019-11-17 MED ORDER — PHENYLEPHRINE 40 MCG/ML (10ML) SYRINGE FOR IV PUSH (FOR BLOOD PRESSURE SUPPORT)
PREFILLED_SYRINGE | INTRAVENOUS | Status: AC
  Filled 2019-11-17: qty 10

## 2019-11-17 MED ORDER — FENTANYL CITRATE (PF) 100 MCG/2ML IJ SOLN
INTRAMUSCULAR | Status: AC
  Filled 2019-11-17: qty 2

## 2019-11-17 MED ORDER — PROPOFOL 10 MG/ML IV BOLUS
INTRAVENOUS | Status: AC
  Filled 2019-11-17: qty 40

## 2019-11-17 MED ORDER — FENTANYL CITRATE (PF) 100 MCG/2ML IJ SOLN
25.0000 ug | INTRAMUSCULAR | Status: DC | PRN
  Filled 2019-11-17: qty 1

## 2019-11-17 MED ORDER — ACETAMINOPHEN 500 MG PO TABS
1000.0000 mg | ORAL_TABLET | Freq: Once | ORAL | Status: AC
  Administered 2019-11-17: 1000 mg via ORAL
  Filled 2019-11-17: qty 2

## 2019-11-17 MED ORDER — FENTANYL CITRATE (PF) 100 MCG/2ML IJ SOLN
INTRAMUSCULAR | Status: DC | PRN
  Administered 2019-11-17 (×2): 50 ug via INTRAVENOUS

## 2019-11-17 SURGICAL SUPPLY — 28 items
BAG DRAIN URO-CYSTO SKYTR STRL (DRAIN) ×3 IMPLANT
CLOTH BEACON ORANGE TIMEOUT ST (SAFETY) ×4 IMPLANT
DRSG TELFA 3X8 NADH (GAUZE/BANDAGES/DRESSINGS) ×3 IMPLANT
ELECT REM PT RETURN 9FT ADLT (ELECTROSURGICAL)
ELECTRODE REM PT RTRN 9FT ADLT (ELECTROSURGICAL) ×2 IMPLANT
GLOVE BIO SURGEON STRL SZ7.5 (GLOVE) ×3 IMPLANT
GOWN STRL REUS W/ TWL XL LVL3 (GOWN DISPOSABLE) ×2 IMPLANT
GOWN STRL REUS W/TWL XL LVL3 (GOWN DISPOSABLE) ×1
INST BIOPSY MAXCORE 18GX25 (NEEDLE) IMPLANT
INSTR BIOPSY MAXCORE 18GX20 (NEEDLE) ×1 IMPLANT
IV NS IRRIG 3000ML ARTHROMATIC (IV SOLUTION) ×1 IMPLANT
KIT TURNOVER CYSTO (KITS) ×3 IMPLANT
MANIFOLD NEPTUNE II (INSTRUMENTS) ×1 IMPLANT
NDL SAFETY ECLIPSE 18X1.5 (NEEDLE) IMPLANT
NDL SPNL 22GX7 QUINCKE BK (NEEDLE) IMPLANT
NEEDLE HYPO 18GX1.5 SHARP (NEEDLE)
NEEDLE HYPO 22GX1.5 SAFETY (NEEDLE) IMPLANT
NEEDLE SPNL 22GX7 QUINCKE BK (NEEDLE) IMPLANT
NS IRRIG 500ML POUR BTL (IV SOLUTION) IMPLANT
PACK CYSTO (CUSTOM PROCEDURE TRAY) ×3 IMPLANT
PAD DRESSING TELFA 3X8 NADH (GAUZE/BANDAGES/DRESSINGS) ×2 IMPLANT
SURGILUBE 2OZ TUBE FLIPTOP (MISCELLANEOUS) ×3 IMPLANT
SYR 20ML LL LF (SYRINGE) IMPLANT
TOWEL OR 17X26 10 PK STRL BLUE (TOWEL DISPOSABLE) ×1 IMPLANT
TUBE CONNECTING 12X1/4 (SUCTIONS) ×1 IMPLANT
UNDERPAD 30X30 (UNDERPADS AND DIAPERS) ×3 IMPLANT
WATER STERILE IRR 3000ML UROMA (IV SOLUTION) ×2 IMPLANT
WATER STERILE IRR 500ML POUR (IV SOLUTION) ×2 IMPLANT

## 2019-11-17 NOTE — Anesthesia Procedure Notes (Signed)
Procedure Name: LMA Insertion Date/Time: 11/17/2019 10:34 AM Performed by: Mechele Claude, CRNA Pre-anesthesia Checklist: Patient identified, Emergency Drugs available, Suction available and Patient being monitored Patient Re-evaluated:Patient Re-evaluated prior to induction Oxygen Delivery Method: Circle system utilized Preoxygenation: Pre-oxygenation with 100% oxygen Induction Type: IV induction Ventilation: Mask ventilation without difficulty LMA: LMA inserted LMA Size: 4.0 Number of attempts: 1 Airway Equipment and Method: Bite block Placement Confirmation: positive ETCO2 Tube secured with: Tape Dental Injury: Teeth and Oropharynx as per pre-operative assessment

## 2019-11-17 NOTE — Op Note (Signed)
Preoperative diagnosis:  1.  Microscopic hematuria 2.  Elevated PSA  Postoperative diagnosis: 1.  Microscopic hematuria 2.  Elevated PSA  Procedures: 1.  Flexible cystoscopy 2.  Transrectal ultrasound of the prostate with ultrasound-guided biopsy of the prostate  Surgeon: Pryor Curia MD  Anesthesia: General  Complications: None  EBL: Minimal  Specimens: 1.  Right lateral base prostate 2.  Right parasagittal base prostate 3.  Right lateral mid prostate 4.  Right parasagittal mid prostate 5.  Right lateral apex prostate 6.  Right parasagittal apex prostate 7.  Left lateral base prostate 8.  Left parasagittal base prostate 9.  Left lateral mid prostate 10.  Left parasagittal mid prostate 11.  Left lateral apex prostate 12.  Left parasagittal apex prostate  Disposition of specimens: Pathology  Indication: This is a 69 year old gentleman with a history of microscopic hematuria and elevated PSA.  He presents for the above procedures.  The potential risks, complications, and expected recovery process was discussed in detail.  Informed consent was obtained.  Due to his inability to tolerate a prostate biopsy in the office, this is being performed in the operating room.  Description of procedure: The patient was taken to the operating room and a general anesthetic was administered.  He was given preoperative antibiotics, placed in the supine position, and prepped and draped in the usual sterile fashion.  A preoperative timeout was performed.  Flexible cystourethroscopy was then performed revealing a normal anterior and posterior urethra.  Inspection of the bladder revealed no bladder tumors, stones, or other mucosal pathology.  His prostatic urethra was significant for an intravesical median lobe.  The cystoscope was then withdrawn and the patient was repositioned in the left lateral decubitus position.  The transrectal ultrasound was placed allowing visualization of the  prostate.  Sequential images were taken of the prostate in the transverse and sagittal views.  There was some generalized hypoechoic echotexture of the peripheral zone of the prostate without discrete lesions.  There was a median lobe noted as previously described.  Prostate volume was measured at 53.3 cc.  Using ultrasound guidance, each biopsy was then performed.  Biopsies were taken from the standard sextant areas of the prostate on both the lateral and parasagittal areas of each region.  A total of 12 biopsies were obtained with the exact locations as noted above.  At the end of the procedure, there was not any significant bleeding noted per rectum.  The patient tolerated the procedure well and without complications.  He was able to be awakened and transferred to the recovery unit in satisfactory condition.

## 2019-11-17 NOTE — Anesthesia Preprocedure Evaluation (Addendum)
Anesthesia Evaluation  Patient identified by MRN, date of birth, ID band Patient awake    Reviewed: Allergy & Precautions, NPO status , Patient's Chart, lab work & pertinent test results, reviewed documented beta blocker date and time   Airway Mallampati: III  TM Distance: >3 FB Neck ROM: Limited    Dental  (+) Dental Advisory Given   Pulmonary asthma , sleep apnea ,    breath sounds clear to auscultation       Cardiovascular negative cardio ROS   Rhythm:Regular Rate:Normal     Neuro/Psych negative neurological ROS     GI/Hepatic Neg liver ROS, hiatal hernia, GERD  ,  Endo/Other  negative endocrine ROS  Renal/GU negative Renal ROS     Musculoskeletal  (+) Arthritis ,   Abdominal   Peds  Hematology negative hematology ROS (+)   Anesthesia Other Findings   Reproductive/Obstetrics                            Lab Results  Component Value Date   WBC 6.5 09/17/2018   HGB 15.8 09/17/2018   HCT 46.1 09/17/2018   MCV 84 09/17/2018   PLT 274 09/17/2018   Lab Results  Component Value Date   CREATININE 0.96 09/17/2018   BUN 14 09/17/2018   NA 142 09/17/2018   K 4.4 09/17/2018   CL 102 09/17/2018   CO2 24 09/17/2018    Anesthesia Physical Anesthesia Plan  ASA: II  Anesthesia Plan: General   Post-op Pain Management:    Induction: Intravenous  PONV Risk Score and Plan: 2 and Ondansetron, Treatment may vary due to age or medical condition and Dexamethasone  Airway Management Planned: LMA and Oral ETT  Additional Equipment:   Intra-op Plan:   Post-operative Plan: Extubation in OR  Informed Consent: I have reviewed the patients History and Physical, chart, labs and discussed the procedure including the risks, benefits and alternatives for the proposed anesthesia with the patient or authorized representative who has indicated his/her understanding and acceptance.     Dental  advisory given  Plan Discussed with: CRNA  Anesthesia Plan Comments:        Anesthesia Quick Evaluation

## 2019-11-17 NOTE — Discharge Instructions (Addendum)
1. You may see some blood in the urine and may have some burning with urination for 48-72 hours. You also may notice that you have to urinate more frequently or urgently after your procedure which is normal.  2. You should call should you develop an inability urinate, fever > 101, persistent nausea and vomiting that prevents you from eating or drinking to stay hydrated.   CYSTOSCOPY HOME CARE INSTRUCTIONS  Activity: Rest for the remainder of the day.  Do not drive or operate equipment today.  You may resume normal activities in one to two days as instructed by your physician.   Meals: Drink plenty of liquids and eat light foods such as gelatin or soup this evening.  You may return to a normal meal plan tomorrow.  Return to Work: You may return to work in one to two days or as instructed by your physician.  Special Instructions / Symptoms: Call your physician if any of these symptoms occur:   -persistent or heavy bleeding  -bleeding which continues after first few urination  -large blood clots that are difficult to pass  -urine stream diminishes or stops completely  -fever equal to or higher than 101 degrees Farenheit.  -cloudy urine with a strong, foul odor  -severe pain  Females should always wipe from front to back after elimination.  You may feel some burning pain when you urinate.  This should disappear with time.  Applying moist heat to the lower abdomen or a hot tub bath may help relieve the pain. \  Follow-Up / Date of Return Visit to Your Physician:  As instructed Call for an appointment to arrange follow-up.  Patient Signature:  ________________________________________________________  Nurse's Signature:  ________________________________________________________     Post Anesthesia Home Care Instructions  Activity: Get plenty of rest for the remainder of the day. A responsible individual must stay with you for 24 hours following the procedure.  For the next 24 hours,  DO NOT: -Drive a car -Paediatric nurse -Drink alcoholic beverages -Take any medication unless instructed by your physician -Make any legal decisions or sign important papers.  Meals: Start with liquid foods such as gelatin or soup. Progress to regular foods as tolerated. Avoid greasy, spicy, heavy foods. If nausea and/or vomiting occur, drink only clear liquids until the nausea and/or vomiting subsides. Call your physician if vomiting continues.  Special Instructions/Symptoms: Your throat may feel dry or sore from the anesthesia or the breathing tube placed in your throat during surgery. If this causes discomfort, gargle with warm salt water. The discomfort should disappear within 24 hours.  If you had a scopolamine patch placed behind your ear for the management of post- operative nausea and/or vomiting:  1. The medication in the patch is effective for 72 hours, after which it should be removed.  Wrap patch in a tissue and discard in the trash. Wash hands thoroughly with soap and water. 2. You may remove the patch earlier than 72 hours if you experience unpleasant side effects which may include dry mouth, dizziness or visual disturbances. 3. Avoid touching the patch. Wash your hands with soap and water after contact with the patch.

## 2019-11-17 NOTE — Anesthesia Postprocedure Evaluation (Signed)
Anesthesia Post Note  Patient: Jesse Church  Procedure(s) Performed: CYSTOSCOPY FLEXIBLE (N/A Bladder) BIOPSY TRANSRECTAL ULTRASONIC PROSTATE (TUBP) (N/A Prostate)     Patient location during evaluation: PACU Anesthesia Type: General Level of consciousness: awake and alert Pain management: pain level controlled Vital Signs Assessment: post-procedure vital signs reviewed and stable Respiratory status: spontaneous breathing, nonlabored ventilation, respiratory function stable and patient connected to nasal cannula oxygen Cardiovascular status: blood pressure returned to baseline and stable Postop Assessment: no apparent nausea or vomiting Anesthetic complications: no    Last Vitals:  Vitals:   11/17/19 1130 11/17/19 1210  BP:  130/75  Pulse: 71 64  Resp: 14 18  Temp:    SpO2: 98% 100%    Last Pain:  Vitals:   11/17/19 1130  TempSrc:   PainSc: 0-No pain                 Tiajuana Amass

## 2019-11-17 NOTE — Transfer of Care (Signed)
   Last Vitals:  Vitals Value Taken Time  BP 129/80 11/17/19 1105  Temp    Pulse 69 11/17/19 1108  Resp 23 11/17/19 1108  SpO2 94 % 11/17/19 1108  Vitals shown include unvalidated device data.  Last Pain:  Vitals:   11/17/19 0916  TempSrc: Oral  PainSc: 3       Patients Stated Pain Goal: 5 (11/17/19 0916) Immediate Anesthesia Transfer of Care Note  Patient: Jesse Church  Procedure(s) Performed: Procedure(s) (LRB): CYSTOSCOPY FLEXIBLE (N/A) BIOPSY TRANSRECTAL ULTRASONIC PROSTATE (TUBP) (N/A)  Patient Location: PACU  Anesthesia Type: General  Level of Consciousness: awake, alert  and oriented  Airway & Oxygen Therapy: Patient Spontanous Breathing and Patient connected to nasal cannula oxygen  Post-op Assessment: Report given to PACU RN and Post -op Vital signs reviewed and stable  Post vital signs: Reviewed and stable  Complications: No apparent anesthesia complications

## 2019-11-18 LAB — SURGICAL PATHOLOGY

## 2019-12-31 NOTE — Progress Notes (Deleted)
Medicare Initial Preventative Physical Exam   {  This SmartText note template is in development as part of the [facility].   This template contains optional SmartLists. If no selections are made from an optional SmartList, it will be automatically removed when the note is signed.  Left clicking SmartLists that are in blue, underlined text will show additional information regarding the patient's history unless you are already editing the note in a pop-up window.  This text will be automatically removed from this note when it is signed.:1}   Patient: Jesse Church, Male    DOB: 1950/11/02, 69 y.o.   MRN: [id number] Visit Date: 12/31/2019  Today's Provider: Wilhemena Durie, MD   Subjective:   No chief complaint on file.   Medicare Initial Preventative Physical Exam Jesse Church is a 69 y.o. male who presents today for his Initial Preventative Physical Exam.   HPI  Social History   Socioeconomic History  . Marital status: Married    Spouse name: Butch Penny  . Number of children: 3  . Years of education: college  . Highest education level: Not on file  Occupational History  . Occupation: Retired  Tobacco Use  . Smoking status: Never Smoker  . Smokeless tobacco: Never Used  Substance and Sexual Activity  . Alcohol use: Yes    Alcohol/week: 7.0 standard drinks    Types: 7 Glasses of wine per week    Comment: 1 glass of wine with dinner  . Drug use: No  . Sexual activity: Not on file  Other Topics Concern  . Not on file  Social History Narrative  . Not on file   Social Determinants of Health   Financial Resource Strain:   . Difficulty of Paying Living Expenses:   Food Insecurity:   . Worried About Charity fundraiser in the Last Year:   . Arboriculturist in the Last Year:   Transportation Needs:   . Film/video editor (Medical):   Marland Kitchen Lack of Transportation (Non-Medical):   Physical Activity:   . Days of Exercise per Week:    . Minutes of Exercise per Session:   Stress:   . Feeling of Stress :   Social Connections:   . Frequency of Communication with Friends and Family:   . Frequency of Social Gatherings with Friends and Family:   . Attends Religious Services:   . Active Member of Clubs or Organizations:   . Attends Archivist Meetings:   Marland Kitchen Marital Status:   Intimate Partner Violence:   . Fear of Current or Ex-Partner:   . Emotionally Abused:   Marland Kitchen Physically Abused:   . Sexually Abused:     Past Medical History:  Diagnosis Date  . Arthritis    oa  . COVID-19 07/2019   joint pain, fever, cough, 50% LOSS OF TASTE AND SMELL.LL SYMPTOMS RESOLVED BY END OF QUARANTINE  . GERD (gastroesophageal reflux disease)   . History of hiatal hernia   . Sleep apnea    could not tolerate cpap     Patient Active Problem List   Diagnosis Date Noted  . Bacterial upper respiratory infection 07/09/2019  . Need for vaccination 07/09/2019  . 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 Chronic pain of both shoulders 02/19/2018  . Pre-diabetes 02/19/2018  . Avascular necrosis of medial femoral condyle, right ([place]) 01/17/2018  . Status post right partial knee  replacement 01/10/2018  . Chondrocalcinosis 10/30/2017  . Primary osteoarthritis of knees, bilateral 10/30/2017  . Hematuria 12/18/2016  . Mixed hyperlipidemia 12/09/2015  . GAD (generalized anxiety disorder) 12/09/2015  . Obstructive apnea 12/09/2015  . Chronic pain 12/09/2015  . Allergic rhinitis 12/09/2015  . Asthma 12/09/2015  . Acid reflux 12/09/2015  . Hyperglycemia 04/16/2015  . Encounter for general adult medical examination without abnormal findings 12/09/2014    Past Surgical History:  Procedure Laterality Date  . APPENDECTOMY  1992  . COLONOSCOPY  2009  . CYSTOSCOPY N/A 11/17/2019   Procedure: CYSTOSCOPY FLEXIBLE;  Surgeon: Raynelle Bring, MD;  Location: [facility];   Service: Urology;  Laterality: N/A;  ONLY NEEDS 30 MIN FOR PROCEDURES  . JOINT REPLACEMENT Right 2019   partial knee  . KNEE SURGERY  1969   torn carttlidge left knee  . PROSTATE BIOPSY N/A 11/17/2019   Procedure: BIOPSY TRANSRECTAL ULTRASONIC PROSTATE (TUBP);  Surgeon: Raynelle Bring, MD;  Location: [facility];  Service: Urology;  Laterality: N/A;  . SHOULDER SURGERY Right 2011   distal clavicle ressection  . TONSILLECTOMY AND ADENOIDECTOMY  as child  . vitrectomy with lens implant on right  2019   right pupil stays chronically dilated    His family history includes Dementia in his father and paternal grandmother; Healthy in his sister; Hepatitis in his mother. There is no history of Colon cancer.  No current outpatient medications on file.   Patient Care Team: Jerrol Banana., MD as PCP - General (Family Medicine)  Review of Systems  {Show previous labs (optional):23779::" "}   Objective:    Vitals: There were no vitals taken for this visit. No exam data present Physical Exam ***  Activities of Daily Living In your present state of health, do you have any difficulty performing the following activities: 11/17/2019  Hearing? N  Vision? N  Difficulty concentrating or making decisions? N  Walking or climbing stairs? N  Dressing or bathing? N  Some recent data might be hidden    Fall Risk Assessment Fall Risk  09/05/2018 06/26/2018 12/18/2016  Falls in the past year? 0 0 No  Follow up Falls evaluation completed - -     Depression Screen PHQ 2/9 Scores 06/26/2018 12/18/2016  PHQ - 2 Score 0 0  PHQ- 9 Score - 0    No flowsheet data found.    Assessment & Plan:    Initial Preventative Physical Exam  Reviewed patient's Family Medical History Reviewed and updated list of patient's medical providers Assessment of cognitive impairment was done Assessed patient's functional ability Established a written schedule for health screening  [place] Completed and Reviewed  Exercise Activities and Dietary recommendations Goals   None     Immunization History  Administered Date(s) Administered  . Fluad Quad(high Dose 65+) 05/14/2019  . Influenza, High Dose Seasonal PF 06/26/2018  . Pneumococcal Conjugate-13 06/26/2018  . Zoster 12/09/2014  . Zoster Recombinat (Shingrix) 03/05/2019, 07/10/2019    Health Maintenance  Topic Date Due  . Hepatitis C Screening  Never done  . TETANUS/TDAP  Never done  . COLONOSCOPY  05/04/2018  . PNA vac Low Risk Adult (2 of 2 - PPSV23) 06/27/2019  . INFLUENZA VACCINE  03/07/2020  . COVID-19 Vaccine  Completed     Discussed health benefits of physical activity, and encouraged him to engage in regular exercise appropriate for his age and condition.   ***     Richard Cranford Mon,  MD  [facility] [phone] (phone) [phone] (fax)  [place]

## 2019-12-31 NOTE — Progress Notes (Signed)
Trena Platt Cummings,acting as a scribe for Jesse Durie, MD.,have documented all relevant documentation on the behalf of Jesse Durie, MD,as directed by  Jesse Durie, MD while in the presence of Jesse Durie, MD.  Annual Wellness Visit      Patient: Jesse Church, Male    DOB: 12/01/1950, 69 y.o.   MRN: [id number] Visit Date: 01/06/2020  Today's Provider: Wilhemena Durie, MD  Subjective:    Chief Complaint  Patient presents with  . Annual Exam   [name] is a 69 y.o. male who presents today for his Annual Wellness Visit.  HPI  Patient eats a regular diet. Patient does not do regular exercise. Patient overall feels well and sleeps well.  He uses CPAP some for sleep apnea.  He has had prostate biopsy in the last year which was negative and he is followed regularly by urology.  He also had a prostate MRI.     Medications: No outpatient medications prior to visit.   No facility-administered medications prior to visit.    No Known Allergies  Patient Care Team: Jerrol Banana., MD as PCP - General (Family Medicine)  Review of Systems  Constitutional: Negative.   HENT: Negative.   Eyes: Positive for photophobia.  Respiratory: Negative.   Cardiovascular: Negative.   Gastrointestinal: Negative.   Endocrine: Negative.   Genitourinary: Negative.   Musculoskeletal: Positive for arthralgias.  Skin: Negative.   Allergic/Immunologic: Negative.   Neurological: Negative.   Hematological: Negative.   Psychiatric/Behavioral: Negative.         Objective:    Vitals: BP (!) 159/68 (BP Location: Left Arm, Patient Position: Sitting, Cuff Size: Normal)   Pulse 87   Temp (!) 97.3 F (36.3 C) (Temporal)   Ht 5\' 6"  (1.676 m)   Wt 164 lb 6.4 oz (74.6 kg)   BMI 26.53 kg/m  BP Readings from Last 3 Encounters:  01/06/20 (!) 159/68  11/17/19 130/75  03/05/19 (!) 161/75   Wt Readings from Last 3 Encounters:  01/06/20 164 lb 6.4  oz (74.6 kg)  11/17/19 162 lb (73.5 kg)  03/05/19 170 lb 6.4 oz (77.3 kg)      Physical Exam Vitals reviewed.  Constitutional:      Appearance: He is well-developed.  HENT:     Head: Normocephalic and atraumatic.     Right Ear: External ear normal.     Left Ear: External ear normal.     Nose: Nose normal.  Eyes:     Conjunctiva/sclera: Conjunctivae normal.     Comments: Right pupil is irregular and dilated from  trauma.  Has  been surgically repaired.  Neck:     Thyroid: No thyromegaly.  Cardiovascular:     Rate and Rhythm: Normal rate and regular rhythm.     Heart sounds: Normal heart sounds.  Pulmonary:     Effort: Pulmonary effort is normal.     Breath sounds: Normal breath sounds.  Abdominal:     Palpations: Abdomen is soft.  Genitourinary:    Penis: Normal.      Testes: Normal.  Musculoskeletal:     Comments: He has very decreased range of motion in both shoulders and has had for several years.  Lymphadenopathy:     Cervical: No cervical adenopathy.  Skin:    General: Skin is warm and dry.  Neurological:     General: No focal deficit present.     Mental Status: He is  alert and oriented to person, place, and time.  Psychiatric:        Mood and Affect: Mood normal.        Behavior: Behavior normal.        Thought Content: Thought content normal.        Judgment: Judgment normal.      Most recent functional status assessment: In your present state of health, do you have any difficulty performing the following activities: 11/17/2019  Hearing? N  Vision? N  Difficulty concentrating or making decisions? N  Walking or climbing stairs? N  Dressing or bathing? N  Some recent data might be hidden    Most recent fall risk assessment: Fall Risk  09/05/2018  Falls in the past year? 0  Follow up Falls evaluation completed     Most recent depression screenings: PHQ 2/9 Scores 06/26/2018 12/18/2016  PHQ - 2 Score 0 0  PHQ- 9 Score - 0    Most recent cognitive  screening: No flowsheet data found.  No results found for any visits on 01/06/20.     Assessment & Plan:      Annual wellness visit done today including the all of the following: Reviewed patient's Family Medical History Reviewed and updated list of patient's medical providers Assessment of cognitive impairment was done Assessed patient's functional ability Established a written schedule for health screening [place] Completed and Reviewed  Exercise Activities and Dietary recommendations Goals   None     Immunization History  Administered Date(s) Administered  . Fluad Quad(high Dose 65+) 05/14/2019  . Influenza, High Dose Seasonal PF 06/26/2018  . Pneumococcal Conjugate-13 06/26/2018  . Zoster 12/09/2014  . Zoster Recombinat (Shingrix) 03/05/2019, 07/10/2019    Health Maintenance  Topic Date Due  . Hepatitis C Screening  Never done  . TETANUS/TDAP  Never done  . COLONOSCOPY  05/04/2018  . PNA vac Low Risk Adult (2 of 2 - PPSV23) 06/27/2019  . INFLUENZA VACCINE  03/07/2020  . COVID-19 Vaccine  Completed     Discussed health benefits of physical activity, and encouraged him to engage in regular exercise appropriate for his age and condition.    1. Mixed hyperlipidemia Last LDL 159. - CBC with Differential/Platelet - Comprehensive metabolic panel - Lipid panel - TSH - Hemoglobin A1c  2. Obstructive apnea Use CPAP nightly - CBC with Differential/Platelet - Comprehensive metabolic panel - Lipid panel - TSH - Hemoglobin A1c  3. Pre-diabetes  - CBC with Differential/Platelet - Comprehensive metabolic panel - Lipid panel - TSH - Hemoglobin A1c  4. GAD (generalized anxiety disorder) Clinically stable - CBC with Differential/Platelet - Comprehensive metabolic panel - Lipid panel - TSH - Hemoglobin A1c  5. Chronic pain of both shoulders Slowly worsening over the years.  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  6. Status post right partial knee replacement   7. Primary osteoarthritis of knees, bilateral   8. Elevated PSA Followed by urology, Dr. Alinda Money.      Jesse Kuchera Cranford Mon, MD  [facility] [phone] (phone) [phone] (fax)  [place]

## 2020-01-06 ENCOUNTER — Ambulatory Visit (INDEPENDENT_AMBULATORY_CARE_PROVIDER_SITE_OTHER): Payer: Medicare Other | Admitting: Family Medicine

## 2020-01-06 ENCOUNTER — Other Ambulatory Visit: Payer: Self-pay

## 2020-01-06 ENCOUNTER — Encounter: Payer: Self-pay | Admitting: Family Medicine

## 2020-01-06 VITALS — BP 159/68 | HR 87 | Temp 97.3°F | Ht 66.0 in | Wt 164.4 lb

## 2020-01-06 DIAGNOSIS — M170 Bilateral primary osteoarthritis of knee: Secondary | ICD-10-CM

## 2020-01-06 DIAGNOSIS — G4733 Obstructive sleep apnea (adult) (pediatric): Secondary | ICD-10-CM

## 2020-01-06 DIAGNOSIS — G8929 Other chronic pain: Secondary | ICD-10-CM | POA: Diagnosis not present

## 2020-01-06 DIAGNOSIS — Z96651 Presence of right artificial knee joint: Secondary | ICD-10-CM

## 2020-01-06 DIAGNOSIS — F411 Generalized anxiety disorder: Secondary | ICD-10-CM

## 2020-01-06 DIAGNOSIS — E782 Mixed hyperlipidemia: Secondary | ICD-10-CM | POA: Diagnosis not present

## 2020-01-06 DIAGNOSIS — R7303 Prediabetes: Secondary | ICD-10-CM

## 2020-01-06 DIAGNOSIS — R972 Elevated prostate specific antigen [PSA]: Secondary | ICD-10-CM | POA: Diagnosis not present

## 2020-01-06 DIAGNOSIS — M25512 Pain in left shoulder: Secondary | ICD-10-CM

## 2020-01-06 DIAGNOSIS — M25511 Pain in right shoulder: Secondary | ICD-10-CM | POA: Diagnosis not present

## 2020-01-15 DIAGNOSIS — G4733 Obstructive sleep apnea (adult) (pediatric): Secondary | ICD-10-CM | POA: Diagnosis not present

## 2020-01-15 DIAGNOSIS — R7303 Prediabetes: Secondary | ICD-10-CM | POA: Diagnosis not present

## 2020-01-15 DIAGNOSIS — E782 Mixed hyperlipidemia: Secondary | ICD-10-CM | POA: Diagnosis not present

## 2020-01-15 DIAGNOSIS — F411 Generalized anxiety disorder: Secondary | ICD-10-CM | POA: Diagnosis not present

## 2020-01-16 LAB — COMPREHENSIVE METABOLIC PANEL
ALT: 15 IU/L (ref 0–44)
AST: 18 IU/L (ref 0–40)
Albumin/Globulin Ratio: 1.8 (ref 1.2–2.2)
Albumin: 4.4 g/dL (ref 3.8–4.8)
Alkaline Phosphatase: 74 IU/L (ref 48–121)
BUN/Creatinine Ratio: 20 (ref 10–24)
BUN: 19 mg/dL (ref 8–27)
Bilirubin Total: 0.5 mg/dL (ref 0.0–1.2)
CO2: 23 mmol/L (ref 20–29)
Calcium: 9.2 mg/dL (ref 8.6–10.2)
Chloride: 105 mmol/L (ref 96–106)
Creatinine, Ser: 0.96 mg/dL (ref 0.76–1.27)
GFR calc Af Amer: 94 mL/min/{1.73_m2} (ref >59)
GFR calc non Af Amer: 81 mL/min/{1.73_m2} (ref >59)
Globulin, Total: 2.5 g/dL (ref 1.5–4.5)
Glucose: 108 mg/dL — ABNORMAL HIGH (ref 65–99)
Potassium: 4.6 mmol/L (ref 3.5–5.2)
Sodium: 145 mmol/L — ABNORMAL HIGH (ref 134–144)
Total Protein: 6.9 g/dL (ref 6.0–8.5)

## 2020-01-16 LAB — CBC WITH DIFFERENTIAL/PLATELET
Basophils Absolute: 0.1 10*3/uL (ref 0.0–0.2)
Basos: 1 %
EOS (ABSOLUTE): 0.1 10*3/uL (ref 0.0–0.4)
Eos: 2 %
Hematocrit: 46.7 % (ref 37.5–51.0)
Hemoglobin: 15.8 g/dL (ref 13.0–17.7)
Immature Grans (Abs): 0 10*3/uL (ref 0.0–0.1)
Immature Granulocytes: 0 %
Lymphocytes Absolute: 2.5 10*3/uL (ref 0.7–3.1)
Lymphs: 42 %
MCH: 28.7 pg (ref 26.6–33.0)
MCHC: 33.8 g/dL (ref 31.5–35.7)
MCV: 85 fL (ref 79–97)
Monocytes Absolute: 0.6 10*3/uL (ref 0.1–0.9)
Monocytes: 10 %
Neutrophils Absolute: 2.7 10*3/uL (ref 1.4–7.0)
Neutrophils: 45 %
Platelets: 295 10*3/uL (ref 150–450)
RBC: 5.51 x10E6/uL (ref 4.14–5.80)
RDW: 14.5 % (ref 11.6–15.4)
WBC: 5.8 10*3/uL (ref 3.4–10.8)

## 2020-01-16 LAB — LIPID PANEL
Chol/HDL Ratio: 3.2 ratio (ref 0.0–5.0)
Cholesterol, Total: 225 mg/dL — ABNORMAL HIGH (ref 100–199)
HDL: 70 mg/dL (ref >39)
LDL Chol Calc (NIH): 146 mg/dL — ABNORMAL HIGH (ref 0–99)
Triglycerides: 53 mg/dL (ref 0–149)
VLDL Cholesterol Cal: 9 mg/dL (ref 5–40)

## 2020-01-16 LAB — HEMOGLOBIN A1C
Est. average glucose Bld gHb Est-mCnc: 117 mg/dL
Hgb A1c MFr Bld: 5.7 % — ABNORMAL HIGH (ref 4.8–5.6)

## 2020-01-16 LAB — TSH: TSH: 1.89 u[IU]/mL (ref 0.450–4.500)

## 2020-01-20 ENCOUNTER — Telehealth: Payer: Self-pay

## 2020-01-20 NOTE — Telephone Encounter (Signed)
Called patient and no answer left voicemail message for patient to return call. If patient calls back okay for PEC to advise of message. 

## 2020-01-20 NOTE — Telephone Encounter (Signed)
-----   Message from Jerrol Banana., MD sent at 01/19/2020  3:37 PM EDT ----- Labs all stable.  Stay hydrated.  Especially in the heat.

## 2020-01-20 NOTE — Telephone Encounter (Signed)
Left message to call back for lab results.

## 2020-01-23 NOTE — Telephone Encounter (Signed)
LMOVM for pt to return call 

## 2020-02-03 DIAGNOSIS — H6123 Impacted cerumen, bilateral: Secondary | ICD-10-CM | POA: Diagnosis not present

## 2020-02-03 DIAGNOSIS — H902 Conductive hearing loss, unspecified: Secondary | ICD-10-CM | POA: Diagnosis not present

## 2020-02-26 ENCOUNTER — Other Ambulatory Visit: Payer: Medicare Other | Attending: General Surgery

## 2020-02-26 DIAGNOSIS — Z01812 Encounter for preprocedural laboratory examination: Secondary | ICD-10-CM | POA: Diagnosis not present

## 2020-03-01 DIAGNOSIS — K640 First degree hemorrhoids: Secondary | ICD-10-CM | POA: Diagnosis not present

## 2020-03-01 DIAGNOSIS — K5730 Diverticulosis of large intestine without perforation or abscess without bleeding: Secondary | ICD-10-CM | POA: Diagnosis not present

## 2020-03-01 DIAGNOSIS — Z1211 Encounter for screening for malignant neoplasm of colon: Secondary | ICD-10-CM | POA: Diagnosis not present

## 2020-03-16 ENCOUNTER — Other Ambulatory Visit
Admission: RE | Admit: 2020-03-16 | Discharge: 2020-03-16 | Disposition: A | Discharge status: Home / Self Care | Payer: Medicare Other | Source: Ambulatory Visit | Attending: General Surgery | Admitting: General Surgery

## 2020-03-16 ENCOUNTER — Other Ambulatory Visit: Payer: Self-pay

## 2020-03-16 DIAGNOSIS — Z01812 Encounter for preprocedural laboratory examination: Secondary | ICD-10-CM | POA: Insufficient documentation

## 2020-03-16 DIAGNOSIS — Z20822 Contact with and (suspected) exposure to covid-19: Secondary | ICD-10-CM | POA: Insufficient documentation

## 2020-03-16 LAB — SARS CORONAVIRUS 2 BY RT PCR (HOSPITAL ORDER, PERFORMED IN ~~LOC~~ HOSPITAL LAB): SARS Coronavirus 2: NEGATIVE

## 2020-06-03 DIAGNOSIS — R972 Elevated prostate specific antigen [PSA]: Secondary | ICD-10-CM | POA: Diagnosis not present

## 2020-06-07 DIAGNOSIS — R972 Elevated prostate specific antigen [PSA]: Secondary | ICD-10-CM | POA: Diagnosis not present

## 2020-06-24 ENCOUNTER — Other Ambulatory Visit: Payer: Self-pay

## 2020-06-24 ENCOUNTER — Ambulatory Visit (INDEPENDENT_AMBULATORY_CARE_PROVIDER_SITE_OTHER): Payer: Medicare Other

## 2020-06-24 DIAGNOSIS — Z23 Encounter for immunization: Secondary | ICD-10-CM

## 2020-09-20 DIAGNOSIS — M25471 Effusion, right ankle: Secondary | ICD-10-CM | POA: Diagnosis not present

## 2020-09-20 DIAGNOSIS — M25571 Pain in right ankle and joints of right foot: Secondary | ICD-10-CM | POA: Diagnosis not present

## 2020-09-20 DIAGNOSIS — S92354A Nondisplaced fracture of fifth metatarsal bone, right foot, initial encounter for closed fracture: Secondary | ICD-10-CM | POA: Diagnosis not present

## 2020-10-01 DIAGNOSIS — S92354A Nondisplaced fracture of fifth metatarsal bone, right foot, initial encounter for closed fracture: Secondary | ICD-10-CM | POA: Diagnosis not present

## 2020-10-01 DIAGNOSIS — S92354D Nondisplaced fracture of fifth metatarsal bone, right foot, subsequent encounter for fracture with routine healing: Secondary | ICD-10-CM | POA: Diagnosis not present

## 2020-10-12 DIAGNOSIS — K645 Perianal venous thrombosis: Secondary | ICD-10-CM | POA: Diagnosis not present

## 2020-11-10 DIAGNOSIS — S99921D Unspecified injury of right foot, subsequent encounter: Secondary | ICD-10-CM | POA: Diagnosis not present

## 2020-11-10 DIAGNOSIS — S92351D Displaced fracture of fifth metatarsal bone, right foot, subsequent encounter for fracture with routine healing: Secondary | ICD-10-CM | POA: Diagnosis not present

## 2020-11-10 DIAGNOSIS — S92354A Nondisplaced fracture of fifth metatarsal bone, right foot, initial encounter for closed fracture: Secondary | ICD-10-CM | POA: Diagnosis not present

## 2020-12-24 DIAGNOSIS — R972 Elevated prostate specific antigen [PSA]: Secondary | ICD-10-CM | POA: Diagnosis not present

## 2020-12-29 DIAGNOSIS — R972 Elevated prostate specific antigen [PSA]: Secondary | ICD-10-CM | POA: Diagnosis not present

## 2021-01-04 DIAGNOSIS — S92354G Nondisplaced fracture of fifth metatarsal bone, right foot, subsequent encounter for fracture with delayed healing: Secondary | ICD-10-CM | POA: Diagnosis not present

## 2021-01-04 DIAGNOSIS — S92351D Displaced fracture of fifth metatarsal bone, right foot, subsequent encounter for fracture with routine healing: Secondary | ICD-10-CM | POA: Diagnosis not present

## 2021-01-04 DIAGNOSIS — M2021 Hallux rigidus, right foot: Secondary | ICD-10-CM | POA: Diagnosis not present

## 2021-01-06 ENCOUNTER — Ambulatory Visit: Payer: Self-pay | Admitting: Family Medicine

## 2021-01-24 DIAGNOSIS — M2021 Hallux rigidus, right foot: Secondary | ICD-10-CM | POA: Diagnosis not present

## 2021-03-11 IMAGING — MR MR PROSTATE WO/W CM
56 series · 56 of 56 positions shown · IV contrast (Multihance 15ml)
Comparison: None.

CLINICAL DATA: Elevated PSA.

Creatinine was obtained on site at [HOSPITAL] at [HOSPITAL].
Results: Creatinine 0.9 mg/dL.
EXAM:
MR PROSTATE WITHOUT AND WITH CONTRAST
TECHNIQUE: Multiplanar multisequence MRI images were obtained of the pelvis
centered about the prostate. Pre and post contrast images were
obtained.
CONTRAST:  15mL MULTIHANCE GADOBENATE DIMEGLUMINE 529 MG/ML IV SOLN

[Series 3: T1 · axial · 8.0mm · 1.06mm/px · 1 of 28 slices shown (1 of 2)]
[im 1/28]
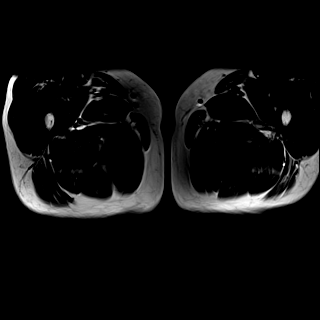

[Series 4: bSSFP fat-sat · axial · 8.0mm · 0.74mm/px · 1 of 28 slices shown]
[im 1/28]
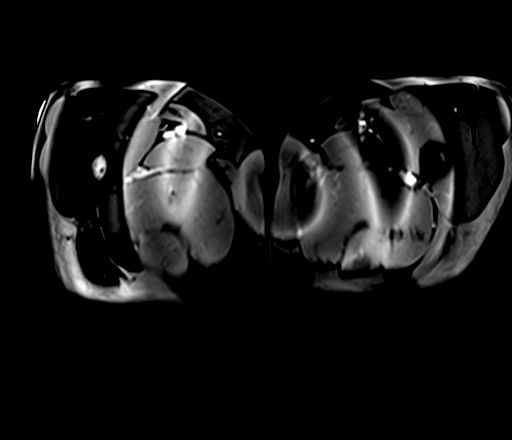

[Series 5: T2 · sagittal · 3.5mm · 0.56mm/px · 1 of 39 slices shown (1 of 4)]
[im 1/39]
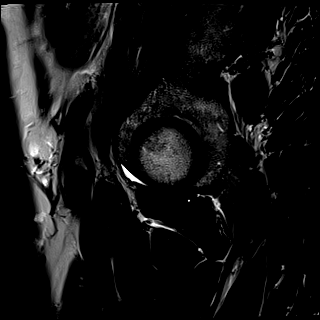

[Series 6: T1 · axial · 3.0mm · 0.31mm/px · 1 of 24 slices shown (2 of 2)]
[im 1/24]
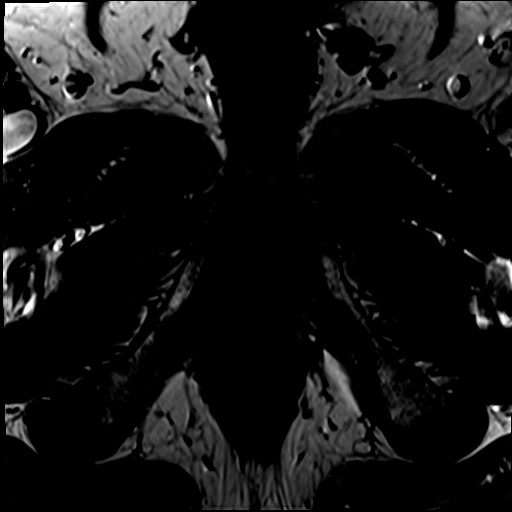

[Series 7: T2 · axial · 3.5mm · 0.56mm/px · 1 of 23 slices shown (2 of 4)]
[im 1/23]
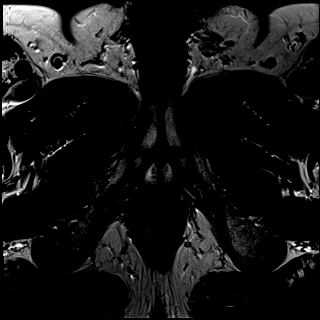

[Series 8: T2 · axial · 1.0mm · 1.04mm/px · 1 of 80 slices shown (3 of 4)]
[im 1/80]
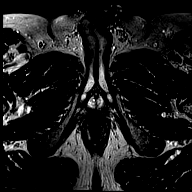

[Series 9: T2 · coronal · 3.5mm · 0.56mm/px · 1 of 23 slices shown (4 of 4)]
[im 1/23]
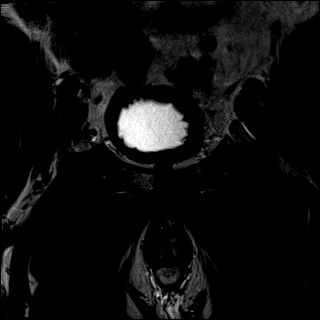

[Series 10: DWI · axial · 3.5mm · 1.56mm/px · 1 of 59 slices shown (1 of 2)]
[im 1/59]
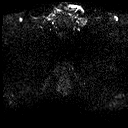

[Series 11: DWI · axial · 3.5mm · 1.56mm/px · 1 of 19 slices shown (2 of 2)]
[im 1/19]
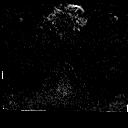

[Series 12: pre t1_twist_tra_dyn_ttc=5.3s · axial · non-contrast · 3.5mm · 0.83mm/px · 1 of 20 slices shown]
[im 1/20]
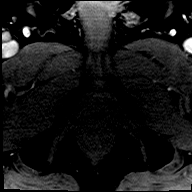

[Series 13: post t1_twist_tra_dyn-copy center · axial · 3.5mm · 0.83mm/px · 1 of 20 slices shown (1 of 24)]
[im 1/20]
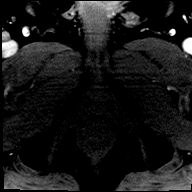

[Series 14: post t1_twist_tra_dyn-copy center · axial · 3.5mm · 0.83mm/px · 1 of 20 slices shown (2 of 24)]
[im 1/20]
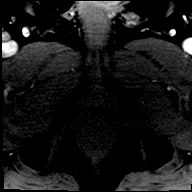

[Series 15: post t1_twist_tra_dyn-copy cent_sub_ttc=(id) · axial · 3.5mm · 0.83mm/px · 1 of 20 slices shown (1 of 22)]
[im 1/20]
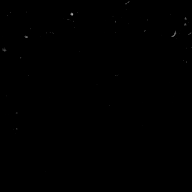

[Series 16: post t1_twist_tra_dyn-copy center · axial · 3.5mm · 0.83mm/px · 1 of 20 slices shown (3 of 24)]
[im 1/20]
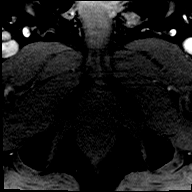

[Series 17: post t1_twist_tra_dyn-copy cent_sub_ttc=(id) · axial · 3.5mm · 0.83mm/px · 1 of 20 slices shown (2 of 22)]
[im 1/20]
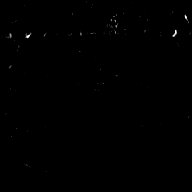

[Series 18: post t1_twist_tra_dyn-copy center · axial · 3.5mm · 0.83mm/px · 1 of 20 slices shown (4 of 24)]
[im 1/20]
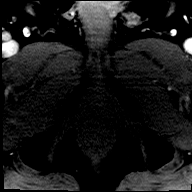

[Series 19: post t1_twist_tra_dyn-copy cent_sub_ttc=(id) · axial · 3.5mm · 0.83mm/px · 1 of 20 slices shown (3 of 22)]
[im 1/20]
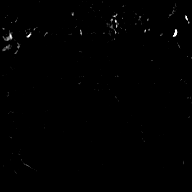

[Series 20: post t1_twist_tra_dyn-copy center · axial · 3.5mm · 0.83mm/px · 1 of 20 slices shown (5 of 24)]
[im 1/20]
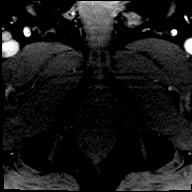

[Series 21: post t1_twist_tra_dyn-copy cent_sub_ttc=(id) · axial · 3.5mm · 0.83mm/px · 1 of 20 slices shown (4 of 22)]
[im 1/20]
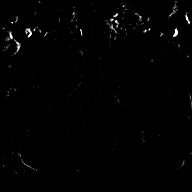

[Series 22: post t1_twist_tra_dyn-copy center · axial · 3.5mm · 0.83mm/px · 1 of 20 slices shown (6 of 24)]
[im 1/20]
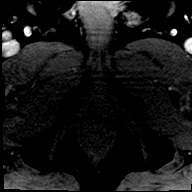

[Series 23: post t1_twist_tra_dyn-copy cent_sub_ttc=(id) · axial · 3.5mm · 0.83mm/px · 1 of 20 slices shown (5 of 22)]
[im 1/20]
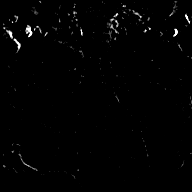

[Series 24: post t1_twist_tra_dyn-copy center · axial · 3.5mm · 0.83mm/px · 1 of 20 slices shown (7 of 24)]
[im 1/20]
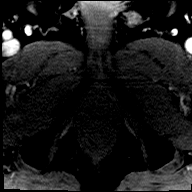

[Series 25: post t1_twist_tra_dyn-copy cent_sub_ttc=(id) · axial · 3.5mm · 0.83mm/px · 1 of 19 slices shown (6 of 22)]
[im 1/19]
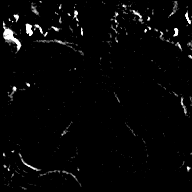

[Series 26: post t1_twist_tra_dyn-copy center · axial · 3.5mm · 0.83mm/px · 1 of 20 slices shown (8 of 24)]
[im 1/20]
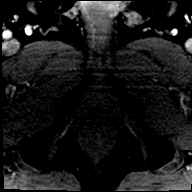

[Series 27: post t1_twist_tra_dyn-copy cent_sub_ttc=(id) · axial · 3.5mm · 0.83mm/px · 1 of 20 slices shown (7 of 22)]
[im 1/20]
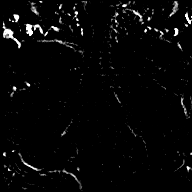

[Series 28: post t1_twist_tra_dyn-copy center · axial · 3.5mm · 0.83mm/px · 1 of 20 slices shown (9 of 24)]
[im 1/20]
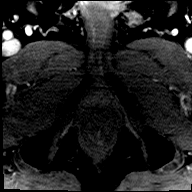

[Series 29: post t1_twist_tra_dyn-copy cent_sub_ttc=(id) · axial · 3.5mm · 0.83mm/px · 1 of 20 slices shown (8 of 22)]
[im 1/20]
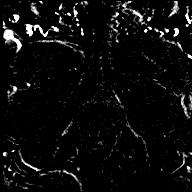

[Series 30: post t1_twist_tra_dyn-copy center · axial · 3.5mm · 0.83mm/px · 1 of 20 slices shown (10 of 24)]
[im 1/20]
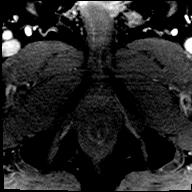

[Series 31: post t1_twist_tra_dyn-copy cent_sub_ttc=(id) · axial · 3.5mm · 0.83mm/px · 1 of 20 slices shown (9 of 22)]
[im 1/20]
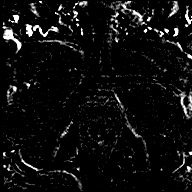

[Series 32: post t1_twist_tra_dyn-copy center · axial · 3.5mm · 0.83mm/px · 1 of 20 slices shown (11 of 24)]
[im 1/20]
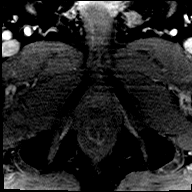

[Series 33: post t1_twist_tra_dyn-copy cent_sub_ttc=(id) · axial · 3.5mm · 0.83mm/px · 1 of 20 slices shown (10 of 22)]
[im 1/20]
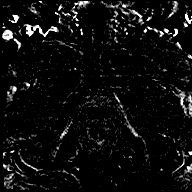

[Series 34: post t1_twist_tra_dyn-copy center · axial · 3.5mm · 0.83mm/px · 1 of 20 slices shown (12 of 24)]
[im 1/20]
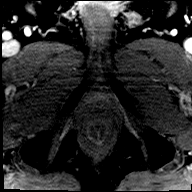

[Series 35: post t1_twist_tra_dyn-copy cent_sub_ttc=(id) · axial · 3.5mm · 0.83mm/px · 1 of 20 slices shown (11 of 22)]
[im 1/20]
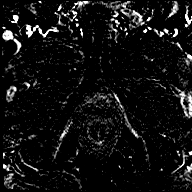

[Series 36: post t1_twist_tra_dyn-copy center · axial · 3.5mm · 0.83mm/px · 1 of 20 slices shown (13 of 24)]
[im 1/20]
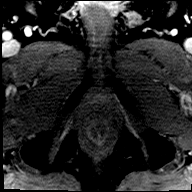

[Series 37: post t1_twist_tra_dyn-copy cent_sub_ttc=(id) · axial · 3.5mm · 0.83mm/px · 1 of 20 slices shown (12 of 22)]
[im 1/20]
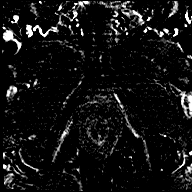

[Series 38: post t1_twist_tra_dyn-copy center · axial · 3.5mm · 0.83mm/px · 1 of 20 slices shown (14 of 24)]
[im 1/20]
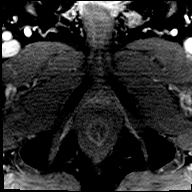

[Series 39: post t1_twist_tra_dyn-copy cent_sub_ttc=(id) · axial · 3.5mm · 0.83mm/px · 1 of 20 slices shown (13 of 22)]
[im 1/20]
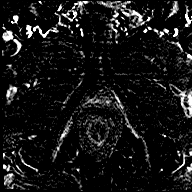

[Series 40: post t1_twist_tra_dyn-copy center · axial · 3.5mm · 0.83mm/px · 1 of 20 slices shown (15 of 24)]
[im 1/20]
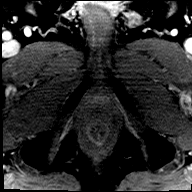

[Series 41: post t1_twist_tra_dyn-copy cent_sub_ttc=(id) · axial · 3.5mm · 0.83mm/px · 1 of 20 slices shown (14 of 22)]
[im 1/20]
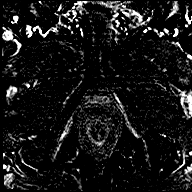

[Series 42: post t1_twist_tra_dyn-copy center · axial · 3.5mm · 0.83mm/px · 1 of 20 slices shown (16 of 24)]
[im 1/20]
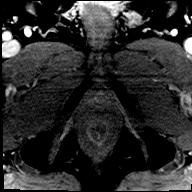

[Series 43: post t1_twist_tra_dyn-copy cent_sub_ttc=(id) · axial · 3.5mm · 0.83mm/px · 1 of 20 slices shown (15 of 22)]
[im 1/20]
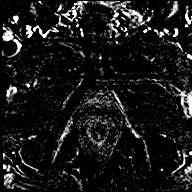

[Series 44: post t1_twist_tra_dyn-copy center · axial · 3.5mm · 0.83mm/px · 1 of 20 slices shown (17 of 24)]
[im 1/20]
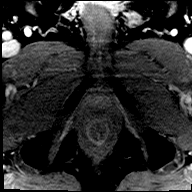

[Series 45: post t1_twist_tra_dyn-copy cent_sub_ttc=(id) · axial · 3.5mm · 0.83mm/px · 1 of 20 slices shown (16 of 22)]
[im 1/20]
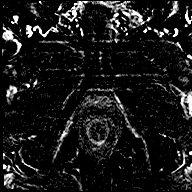

[Series 46: post t1_twist_tra_dyn-copy center · axial · 3.5mm · 0.83mm/px · 1 of 20 slices shown (18 of 24)]
[im 1/20]
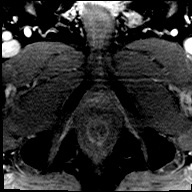

[Series 47: post t1_twist_tra_dyn-copy cent_sub_ttc=(id) · axial · 3.5mm · 0.83mm/px · 1 of 20 slices shown (17 of 22)]
[im 1/20]
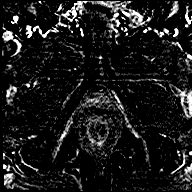

[Series 48: post t1_twist_tra_dyn-copy center · axial · 3.5mm · 0.83mm/px · 1 of 20 slices shown (19 of 24)]
[im 1/20]
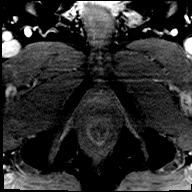

[Series 49: post t1_twist_tra_dyn-copy cent_sub_ttc=(id) · axial · 3.5mm · 0.83mm/px · 1 of 20 slices shown (18 of 22)]
[im 1/20]
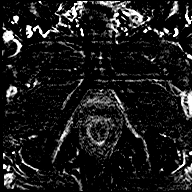

[Series 50: post t1_twist_tra_dyn-copy center · axial · 3.5mm · 0.83mm/px · 1 of 20 slices shown (20 of 24)]
[im 1/20]
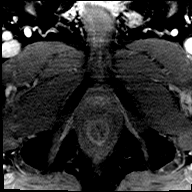

[Series 51: post t1_twist_tra_dyn-copy cent_sub_ttc=(id) · axial · 3.5mm · 0.83mm/px · 1 of 20 slices shown (19 of 22)]
[im 1/20]
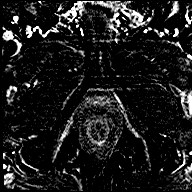

[Series 52: post t1_twist_tra_dyn-copy center · axial · 3.5mm · 0.83mm/px · 1 of 20 slices shown (21 of 24)]
[im 1/20]
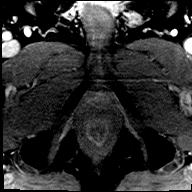

[Series 53: post t1_twist_tra_dyn-copy cent_sub_ttc=(id) · axial · 3.5mm · 0.83mm/px · 1 of 20 slices shown (20 of 22)]
[im 1/20]
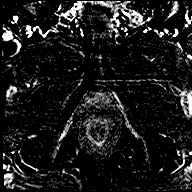

[Series 54: post t1_twist_tra_dyn-copy center · axial · 3.5mm · 0.83mm/px · 1 of 20 slices shown (22 of 24)]
[im 1/20]
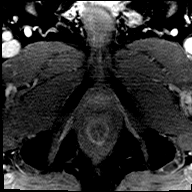

[Series 55: post t1_twist_tra_dyn-copy cent_sub_ttc=(id) · axial · 3.5mm · 0.83mm/px · 1 of 20 slices shown (21 of 22)]
[im 1/20]
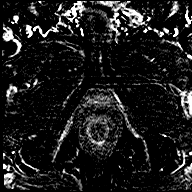

[Series 56: post t1_twist_tra_dyn-copy center · axial · 3.5mm · 0.83mm/px · 1 of 20 slices shown (23 of 24)]
[im 1/20]
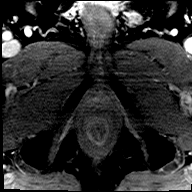

[Series 57: post t1_twist_tra_dyn-copy cent_sub_ttc=(id) · axial · 3.5mm · 0.83mm/px · 1 of 20 slices shown (22 of 22)]
[im 1/20]
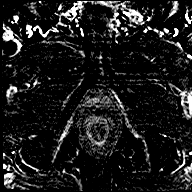

[Series 58: post t1_twist_tra_dyn-copy center · axial · 3.5mm · 0.83mm/px · 1 of 20 slices shown (24 of 24)]
[im 1/20]
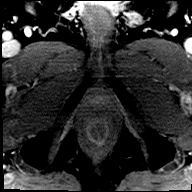

[56 of 56 positions shown; findings below may reference images not displayed]

FINDINGS: Prostate:

-- Peripheral Zone: No abnormality seen on ADC and high b-value DWI
sequences.

-- Transition/Central Zone: A large circumscribed BPH nodule is
noted, but no suspicious nodules with obscured or non-circumscribed
margins seen.

-- Measurements/Volume:  5.3 x 4.8 x 5.2 cm (volume = 69 cm^3)

Transcapsular spread:  Absent

Seminal vesicle involvement:  Absent

Neurovascular bundle involvement:  Absent

Pelvic adenopathy: None visualized

Bone metastasis: None visualized

Other: Diffuse bladder wall thickening, consistent with chronic
bladder outlet obstruction. Diverticulosis involving the visualized
portion of the sigmoid colon.
IMPRESSION: No radiographic evidence of high-grade prostate carcinoma. PI-RADS
1: Very Low (clinically significant cancer is highly unlikely to be
present)

Findings consistent with chronic bladder outlet obstruction.

## 2021-03-30 ENCOUNTER — Ambulatory Visit: Payer: Medicare Other | Admitting: Family Medicine

## 2021-04-14 DIAGNOSIS — Z96651 Presence of right artificial knee joint: Secondary | ICD-10-CM | POA: Diagnosis not present

## 2021-04-14 DIAGNOSIS — Z471 Aftercare following joint replacement surgery: Secondary | ICD-10-CM | POA: Diagnosis not present

## 2021-04-14 DIAGNOSIS — M25562 Pain in left knee: Secondary | ICD-10-CM | POA: Diagnosis not present

## 2021-04-14 DIAGNOSIS — M25561 Pain in right knee: Secondary | ICD-10-CM | POA: Diagnosis not present

## 2021-04-14 DIAGNOSIS — M87051 Idiopathic aseptic necrosis of right femur: Secondary | ICD-10-CM | POA: Diagnosis not present

## 2021-04-14 DIAGNOSIS — M1180 Other specified crystal arthropathies, unspecified site: Secondary | ICD-10-CM | POA: Diagnosis not present

## 2021-05-09 DIAGNOSIS — H6123 Impacted cerumen, bilateral: Secondary | ICD-10-CM | POA: Diagnosis not present

## 2021-05-09 DIAGNOSIS — H902 Conductive hearing loss, unspecified: Secondary | ICD-10-CM | POA: Diagnosis not present

## 2021-05-09 DIAGNOSIS — H6690 Otitis media, unspecified, unspecified ear: Secondary | ICD-10-CM | POA: Diagnosis not present

## 2021-05-09 DIAGNOSIS — H90A11 Conductive hearing loss, unilateral, right ear with restricted hearing on the contralateral side: Secondary | ICD-10-CM | POA: Diagnosis not present

## 2021-05-26 ENCOUNTER — Other Ambulatory Visit: Payer: Self-pay

## 2021-05-26 ENCOUNTER — Encounter: Payer: Self-pay | Admitting: Family Medicine

## 2021-05-26 ENCOUNTER — Ambulatory Visit (INDEPENDENT_AMBULATORY_CARE_PROVIDER_SITE_OTHER): Payer: Medicare Other | Admitting: Family Medicine

## 2021-05-26 VITALS — BP 139/81 | HR 75 | Temp 97.7°F | Resp 16 | Ht 68.0 in | Wt 170.0 lb

## 2021-05-26 DIAGNOSIS — Z23 Encounter for immunization: Secondary | ICD-10-CM

## 2021-05-26 DIAGNOSIS — M170 Bilateral primary osteoarthritis of knee: Secondary | ICD-10-CM | POA: Diagnosis not present

## 2021-05-26 DIAGNOSIS — M25512 Pain in left shoulder: Secondary | ICD-10-CM | POA: Diagnosis not present

## 2021-05-26 DIAGNOSIS — M25511 Pain in right shoulder: Secondary | ICD-10-CM | POA: Diagnosis not present

## 2021-05-26 DIAGNOSIS — G8929 Other chronic pain: Secondary | ICD-10-CM

## 2021-05-26 DIAGNOSIS — R3121 Asymptomatic microscopic hematuria: Secondary | ICD-10-CM | POA: Diagnosis not present

## 2021-05-26 DIAGNOSIS — E782 Mixed hyperlipidemia: Secondary | ICD-10-CM

## 2021-05-26 DIAGNOSIS — R7303 Prediabetes: Secondary | ICD-10-CM | POA: Diagnosis not present

## 2021-05-26 DIAGNOSIS — Z0000 Encounter for general adult medical examination without abnormal findings: Secondary | ICD-10-CM

## 2021-05-26 DIAGNOSIS — G4733 Obstructive sleep apnea (adult) (pediatric): Secondary | ICD-10-CM

## 2021-05-26 DIAGNOSIS — R972 Elevated prostate specific antigen [PSA]: Secondary | ICD-10-CM

## 2021-05-26 NOTE — Progress Notes (Signed)
I,April Miller,acting as a scribe for Wilhemena Durie, MD.,have documented all relevant documentation on the behalf of Wilhemena Durie, MD,as directed by  Wilhemena Durie, MD while in the presence of Wilhemena Durie, MD.  Annual Wellness Visit     Patient: Jesse Church, Male    DOB: May 21, 1951, 70 y.o.   MRN: [id number] Visit Date: 05/26/2021  Today's Provider: Wilhemena Durie, MD   Chief Complaint  Patient presents with   Medicare [place] Jesse Church is a 70 y.o. male who presents today for his Annual Wellness Visit. He reports consuming a general diet. Home exercise routine includes walking. He generally feels well. He reports sleeping well. He does not have additional problems to discuss today.   HPI He continues to have chronic pain and is unable to use his right shoulder at all and has very limited use of his left shoulder.  He certainly is unable to work due to the chronic shoulder pain.  He also has severe limitations of range of motion of his shoulder from the degenerative changes.  Blood pressures at home are good. Have not problems with CPAP is even with humidifier his mouth and throat are very dry in the morning.   Medications: No outpatient medications prior to visit.   No facility-administered medications prior to visit.    No Known Allergies  Patient Care Team: Jerrol Banana., MD as PCP - General (Family Medicine)  Review of Systems  Musculoskeletal:  Positive for arthralgias.  All other systems reviewed and are negative.       Objective    Vitals: BP 139/81 (BP Location: Right Arm, Patient Position: Sitting, Cuff Size: Large)   Pulse 75   Temp 97.7 F (36.5 C) (Temporal)   Resp 16   Ht 5\' 8"  (1.727 m)   Wt 170 lb (77.1 kg)   SpO2 98%   BMI 25.85 kg/m  BP Readings from Last 3 Encounters:  05/26/21 139/81  01/06/20 (!) 159/68  11/17/19 130/75   Wt Readings from Last 3 Encounters:   05/26/21 170 lb (77.1 kg)  01/06/20 164 lb 6.4 oz (74.6 kg)  11/17/19 162 lb (73.5 kg)      Physical Exam Constitutional:      Appearance: Normal appearance. He is normal weight.  HENT:     Head: Normocephalic and atraumatic.     Right Ear: Tympanic membrane, ear canal and external ear normal.     Left Ear: Tympanic membrane, ear canal and external ear normal.     Nose: Nose normal.     Mouth/Throat:     Mouth: Mucous membranes are moist.     Pharynx: Oropharynx is clear.  Eyes:     Extraocular Movements: Extraocular movements intact.     Conjunctiva/sclera: Conjunctivae normal.     Pupils: Pupils are equal, round, and reactive to light.  Cardiovascular:     Rate and Rhythm: Normal rate and regular rhythm.     Pulses: Normal pulses.     Heart sounds: Normal heart sounds.  Pulmonary:     Effort: Pulmonary effort is normal.     Breath sounds: Normal breath sounds.  Abdominal:     General: Abdomen is flat. Bowel sounds are normal.     Palpations: Abdomen is soft.  Musculoskeletal:     Cervical back: Normal range of motion and neck supple.     Comments: Greatly decreased range  of motion in both shoulders especially on the right.  This is actually worsening and not improving.  Skin:    General: Skin is warm and dry.  Neurological:     General: No focal deficit present.     Mental Status: He is alert and oriented to person, place, and time. Mental status is at baseline.  Psychiatric:        Mood and Affect: Mood normal.        Behavior: Behavior normal.        Thought Content: Thought content normal.        Judgment: Judgment normal.    Most recent functional status assessment: No flowsheet data found. Most recent fall risk assessment: Fall Risk  09/05/2018  Falls in the past year? 0  Follow up Falls evaluation completed    Most recent depression screenings: PHQ 2/9 Scores 06/26/2018 12/18/2016  PHQ - 2 Score 0 0  PHQ- 9 Score - 0   Most recent cognitive  screening: No flowsheet data found. Most recent Audit-C alcohol use screening Alcohol Use Disorder Test (AUDIT) 06/26/2018  1. How often do you have a drink containing alcohol? 4  2. How many drinks containing alcohol do you have on a typical day when you are drinking? 0  3. How often do you have six or more drinks on one occasion? 0  AUDIT-C Score 4  4. How often during the last year have you found that you were not able to stop drinking once you had started? 0  5. How often during the last year have you failed to do what was normally expected from you because of drinking? 0  6. How often during the last year have you needed a first drink in the morning to get yourself going after a heavy drinking session? 0  7. How often during the last year have you had a feeling of guilt of remorse after drinking? 0  8. How often during the last year have you been unable to remember what happened the night before because you had been drinking? 0  9. Have you or someone else been injured as a result of your drinking? 0  10. Has a relative or friend or a doctor or another health worker been concerned about your drinking or suggested you cut down? 0  Alcohol Use Disorder Identification Test Final Score (AUDIT) 4  Alcohol Brief Interventions/Follow-up AUDIT Score <7 follow-up not indicated   A score of 3 or more in women, and 4 or more in men indicates increased risk for alcohol abuse, EXCEPT if all of the points are from question 1   No results found for any visits on 05/26/21.  Assessment & Plan     Annual wellness visit done today including the all of the following: Reviewed patient's Family Medical History Reviewed and updated list of patient's medical providers Assessment of cognitive impairment was done Assessed patient's functional ability Established a written schedule for health screening [place] Completed and Reviewed  Exercise Activities and Dietary recommendations   Goals   None     Immunization History  Administered Date(s) Administered   Fluad Quad(high Dose 65+) 05/14/2019, 06/24/2020   Influenza, High Dose Seasonal PF 06/26/2018   PFIZER(Purple Top)SARS-COV-2 Vaccination 09/19/2019, 10/10/2019, 06/24/2020   Pneumococcal Conjugate-13 06/26/2018   Zoster Recombinat (Shingrix) 03/05/2019, 07/10/2019   Zoster, Live 12/09/2014    Health Maintenance  Topic Date Due   Hepatitis C Screening  Never done   TETANUS/TDAP  Never done   COLONOSCOPY (Pts 45-30yrs Insurance coverage will need to be confirmed)  05/04/2018   Pneumonia Vaccine 3+ Years old (2 - PPSV23 if available, else PCV20) 06/27/2019   COVID-19 Vaccine (4 - Booster for Pfizer series) 08/19/2020   INFLUENZA VACCINE  03/07/2021   Zoster Vaccines- Shingrix  Completed   HPV VACCINES  Aged Out   1. Encounter for annual wellness exam in Medicare patient   2. Mixed hyperlipidemia  - Comprehensive metabolic panel - Lipid Panel With LDL/HDL Ratio  3. Pre-diabetes Follow labs and maintain diet and exercise - CBC with Differential/Platelet - Comprehensive metabolic panel - Lipid Panel With LDL/HDL Ratio - TSH  4. Elevated PSA Patient had prostate biopsy earlier this year by Dr. Alinda Money - PSA  5. Need for influenza vaccination  - Flu Vaccine QUAD High Dose(Fluad)  6. Need for pneumococcal vaccine  - Pneumococcal polysaccharide vaccine 23-valent greater than or equal to 2yo subcutaneous/IM  7. Obstructive apnea Can consider referral to Dr. Wynonia Hazard because of problems with dryness  8. Primary osteoarthritis of knees, bilateral   9. Chronic pain of both shoulders There is absolutely limits patient ability to do any work.  He is completely disabled from work  61. Asymptomatic microscopic hematuria Patient has cystoscopy earlier by urology Dr. Lajuan Lines  11. Other chronic pain Patient able to manage his pain   Discussed health benefits of physical activity, and encouraged  him to engage in regular exercise appropriate for his age and condition.   Return in about 1 year (around 05/26/2022).     I, Wilhemena Durie, MD, have reviewed all documentation for this visit. The documentation on 06/01/21 for the exam, diagnosis, procedures, and orders are all accurate and complete.    Miniya Miguez Cranford Mon, MD  [facility] [phone] (phone) [phone] (fax)  [employer]

## 2021-06-16 DIAGNOSIS — R7303 Prediabetes: Secondary | ICD-10-CM | POA: Diagnosis not present

## 2021-06-16 DIAGNOSIS — E782 Mixed hyperlipidemia: Secondary | ICD-10-CM | POA: Diagnosis not present

## 2021-06-16 DIAGNOSIS — R972 Elevated prostate specific antigen [PSA]: Secondary | ICD-10-CM | POA: Diagnosis not present

## 2021-06-17 LAB — COMPREHENSIVE METABOLIC PANEL
ALT: 16 IU/L (ref 0–44)
AST: 17 IU/L (ref 0–40)
Albumin/Globulin Ratio: 2.3 — ABNORMAL HIGH (ref 1.2–2.2)
Albumin: 4.6 g/dL (ref 3.8–4.8)
Alkaline Phosphatase: 76 IU/L (ref 44–121)
BUN/Creatinine Ratio: 18 (ref 10–24)
BUN: 17 mg/dL (ref 8–27)
Bilirubin Total: 0.5 mg/dL (ref 0.0–1.2)
CO2: 24 mmol/L (ref 20–29)
Calcium: 9.3 mg/dL (ref 8.6–10.2)
Chloride: 104 mmol/L (ref 96–106)
Creatinine, Ser: 0.94 mg/dL (ref 0.76–1.27)
Globulin, Total: 2 g/dL (ref 1.5–4.5)
Glucose: 102 mg/dL — ABNORMAL HIGH (ref 70–99)
Potassium: 4.7 mmol/L (ref 3.5–5.2)
Sodium: 141 mmol/L (ref 134–144)
Total Protein: 6.6 g/dL (ref 6.0–8.5)
eGFR: 88 mL/min/{1.73_m2} (ref >59)

## 2021-06-17 LAB — CBC WITH DIFFERENTIAL/PLATELET
Basophils Absolute: 0.1 10*3/uL (ref 0.0–0.2)
Basos: 1 %
EOS (ABSOLUTE): 0.2 10*3/uL (ref 0.0–0.4)
Eos: 3 %
Hematocrit: 43.6 % (ref 37.5–51.0)
Hemoglobin: 15.7 g/dL (ref 13.0–17.7)
Immature Grans (Abs): 0 10*3/uL (ref 0.0–0.1)
Immature Granulocytes: 0 %
Lymphocytes Absolute: 2.4 10*3/uL (ref 0.7–3.1)
Lymphs: 40 %
MCH: 30.5 pg (ref 26.6–33.0)
MCHC: 36 g/dL — ABNORMAL HIGH (ref 31.5–35.7)
MCV: 85 fL (ref 79–97)
Monocytes Absolute: 0.6 10*3/uL (ref 0.1–0.9)
Monocytes: 11 %
Neutrophils Absolute: 2.7 10*3/uL (ref 1.4–7.0)
Neutrophils: 45 %
Platelets: 312 10*3/uL (ref 150–450)
RBC: 5.15 x10E6/uL (ref 4.14–5.80)
RDW: 13.2 % (ref 11.6–15.4)
WBC: 6.1 10*3/uL (ref 3.4–10.8)

## 2021-06-17 LAB — LIPID PANEL WITH LDL/HDL RATIO
Cholesterol, Total: 218 mg/dL — ABNORMAL HIGH (ref 100–199)
HDL: 58 mg/dL (ref >39)
LDL Chol Calc (NIH): 142 mg/dL — ABNORMAL HIGH (ref 0–99)
LDL/HDL Ratio: 2.4 ratio (ref 0.0–3.6)
Triglycerides: 99 mg/dL (ref 0–149)
VLDL Cholesterol Cal: 18 mg/dL (ref 5–40)

## 2021-06-17 LAB — TSH: TSH: 1.4 u[IU]/mL (ref 0.450–4.500)

## 2021-08-03 ENCOUNTER — Encounter: Payer: Medicare Other | Admitting: Family Medicine

## 2021-11-28 DIAGNOSIS — M2021 Hallux rigidus, right foot: Secondary | ICD-10-CM | POA: Diagnosis not present

## 2021-11-28 DIAGNOSIS — S92351D Displaced fracture of fifth metatarsal bone, right foot, subsequent encounter for fracture with routine healing: Secondary | ICD-10-CM | POA: Diagnosis not present

## 2021-11-28 DIAGNOSIS — S99199D Other physeal fracture of unspecified metatarsal, subsequent encounter for fracture with routine healing: Secondary | ICD-10-CM | POA: Diagnosis not present

## 2021-11-28 DIAGNOSIS — M79671 Pain in right foot: Secondary | ICD-10-CM | POA: Diagnosis not present

## 2021-12-14 DIAGNOSIS — H43812 Vitreous degeneration, left eye: Secondary | ICD-10-CM | POA: Diagnosis not present

## 2021-12-21 DIAGNOSIS — R972 Elevated prostate specific antigen [PSA]: Secondary | ICD-10-CM | POA: Diagnosis not present

## 2021-12-28 DIAGNOSIS — R972 Elevated prostate specific antigen [PSA]: Secondary | ICD-10-CM | POA: Diagnosis not present

## 2022-02-02 DIAGNOSIS — D225 Melanocytic nevi of trunk: Secondary | ICD-10-CM | POA: Diagnosis not present

## 2022-02-02 DIAGNOSIS — D2262 Melanocytic nevi of left upper limb, including shoulder: Secondary | ICD-10-CM | POA: Diagnosis not present

## 2022-02-02 DIAGNOSIS — L570 Actinic keratosis: Secondary | ICD-10-CM | POA: Diagnosis not present

## 2022-02-02 DIAGNOSIS — D2271 Melanocytic nevi of right lower limb, including hip: Secondary | ICD-10-CM | POA: Diagnosis not present

## 2022-02-02 DIAGNOSIS — D2272 Melanocytic nevi of left lower limb, including hip: Secondary | ICD-10-CM | POA: Diagnosis not present

## 2022-02-02 DIAGNOSIS — D2261 Melanocytic nevi of right upper limb, including shoulder: Secondary | ICD-10-CM | POA: Diagnosis not present

## 2022-02-02 DIAGNOSIS — L821 Other seborrheic keratosis: Secondary | ICD-10-CM | POA: Diagnosis not present

## 2022-02-08 DIAGNOSIS — M79671 Pain in right foot: Secondary | ICD-10-CM | POA: Diagnosis not present

## 2022-02-08 DIAGNOSIS — M2021 Hallux rigidus, right foot: Secondary | ICD-10-CM | POA: Diagnosis not present

## 2022-04-11 DIAGNOSIS — H6123 Impacted cerumen, bilateral: Secondary | ICD-10-CM | POA: Diagnosis not present

## 2022-04-11 DIAGNOSIS — H900 Conductive hearing loss, bilateral: Secondary | ICD-10-CM | POA: Diagnosis not present

## 2022-05-30 ENCOUNTER — Ambulatory Visit: Payer: Medicare Other | Admitting: Family Medicine

## 2022-06-19 DIAGNOSIS — H2512 Age-related nuclear cataract, left eye: Secondary | ICD-10-CM | POA: Diagnosis not present

## 2022-06-19 DIAGNOSIS — H43812 Vitreous degeneration, left eye: Secondary | ICD-10-CM | POA: Diagnosis not present

## 2022-06-19 DIAGNOSIS — Z961 Presence of intraocular lens: Secondary | ICD-10-CM | POA: Diagnosis not present

## 2022-08-18 DIAGNOSIS — M25512 Pain in left shoulder: Secondary | ICD-10-CM | POA: Diagnosis not present

## 2022-08-18 DIAGNOSIS — M19012 Primary osteoarthritis, left shoulder: Secondary | ICD-10-CM | POA: Diagnosis not present

## 2022-08-29 DIAGNOSIS — G894 Chronic pain syndrome: Secondary | ICD-10-CM | POA: Diagnosis not present

## 2022-08-29 DIAGNOSIS — E782 Mixed hyperlipidemia: Secondary | ICD-10-CM | POA: Diagnosis not present

## 2022-08-29 DIAGNOSIS — R972 Elevated prostate specific antigen [PSA]: Secondary | ICD-10-CM | POA: Diagnosis not present

## 2022-08-29 DIAGNOSIS — M25512 Pain in left shoulder: Secondary | ICD-10-CM | POA: Diagnosis not present

## 2022-08-29 DIAGNOSIS — R5382 Chronic fatigue, unspecified: Secondary | ICD-10-CM | POA: Diagnosis not present

## 2022-08-29 DIAGNOSIS — F411 Generalized anxiety disorder: Secondary | ICD-10-CM | POA: Diagnosis not present

## 2022-08-29 DIAGNOSIS — M25511 Pain in right shoulder: Secondary | ICD-10-CM | POA: Diagnosis not present

## 2022-09-13 DIAGNOSIS — S46112A Strain of muscle, fascia and tendon of long head of biceps, left arm, initial encounter: Secondary | ICD-10-CM | POA: Diagnosis not present

## 2022-09-13 DIAGNOSIS — S46812A Strain of other muscles, fascia and tendons at shoulder and upper arm level, left arm, initial encounter: Secondary | ICD-10-CM | POA: Diagnosis not present

## 2022-09-13 DIAGNOSIS — M7582 Other shoulder lesions, left shoulder: Secondary | ICD-10-CM | POA: Diagnosis not present

## 2022-09-22 DIAGNOSIS — E782 Mixed hyperlipidemia: Secondary | ICD-10-CM | POA: Diagnosis not present

## 2022-09-22 DIAGNOSIS — R5382 Chronic fatigue, unspecified: Secondary | ICD-10-CM | POA: Diagnosis not present

## 2022-09-22 DIAGNOSIS — G894 Chronic pain syndrome: Secondary | ICD-10-CM | POA: Diagnosis not present

## 2022-10-11 DIAGNOSIS — M67922 Unspecified disorder of synovium and tendon, left upper arm: Secondary | ICD-10-CM | POA: Diagnosis not present

## 2022-10-11 DIAGNOSIS — M25512 Pain in left shoulder: Secondary | ICD-10-CM | POA: Diagnosis not present

## 2022-10-11 DIAGNOSIS — M19012 Primary osteoarthritis, left shoulder: Secondary | ICD-10-CM | POA: Diagnosis not present

## 2022-10-11 DIAGNOSIS — M75122 Complete rotator cuff tear or rupture of left shoulder, not specified as traumatic: Secondary | ICD-10-CM | POA: Diagnosis not present

## 2022-11-08 DIAGNOSIS — M7502 Adhesive capsulitis of left shoulder: Secondary | ICD-10-CM | POA: Diagnosis not present

## 2022-11-08 DIAGNOSIS — M75112 Incomplete rotator cuff tear or rupture of left shoulder, not specified as traumatic: Secondary | ICD-10-CM | POA: Diagnosis not present

## 2022-12-12 DIAGNOSIS — M7502 Adhesive capsulitis of left shoulder: Secondary | ICD-10-CM | POA: Diagnosis not present

## 2022-12-12 DIAGNOSIS — M25512 Pain in left shoulder: Secondary | ICD-10-CM | POA: Diagnosis not present

## 2022-12-12 DIAGNOSIS — M75112 Incomplete rotator cuff tear or rupture of left shoulder, not specified as traumatic: Secondary | ICD-10-CM | POA: Diagnosis not present

## 2022-12-12 DIAGNOSIS — M25511 Pain in right shoulder: Secondary | ICD-10-CM | POA: Diagnosis not present

## 2023-01-05 DIAGNOSIS — R972 Elevated prostate specific antigen [PSA]: Secondary | ICD-10-CM | POA: Diagnosis not present

## 2023-01-09 DIAGNOSIS — M75112 Incomplete rotator cuff tear or rupture of left shoulder, not specified as traumatic: Secondary | ICD-10-CM | POA: Diagnosis not present

## 2023-01-09 DIAGNOSIS — M7502 Adhesive capsulitis of left shoulder: Secondary | ICD-10-CM | POA: Diagnosis not present

## 2023-01-09 DIAGNOSIS — M25511 Pain in right shoulder: Secondary | ICD-10-CM | POA: Diagnosis not present

## 2023-01-09 DIAGNOSIS — M25512 Pain in left shoulder: Secondary | ICD-10-CM | POA: Diagnosis not present

## 2023-01-12 DIAGNOSIS — R972 Elevated prostate specific antigen [PSA]: Secondary | ICD-10-CM | POA: Diagnosis not present

## 2023-01-23 DIAGNOSIS — M75112 Incomplete rotator cuff tear or rupture of left shoulder, not specified as traumatic: Secondary | ICD-10-CM | POA: Diagnosis not present

## 2023-01-23 DIAGNOSIS — M25511 Pain in right shoulder: Secondary | ICD-10-CM | POA: Diagnosis not present

## 2023-01-23 DIAGNOSIS — M25512 Pain in left shoulder: Secondary | ICD-10-CM | POA: Diagnosis not present

## 2023-01-23 DIAGNOSIS — M7502 Adhesive capsulitis of left shoulder: Secondary | ICD-10-CM | POA: Diagnosis not present

## 2023-02-05 DIAGNOSIS — D2271 Melanocytic nevi of right lower limb, including hip: Secondary | ICD-10-CM | POA: Diagnosis not present

## 2023-02-05 DIAGNOSIS — L570 Actinic keratosis: Secondary | ICD-10-CM | POA: Diagnosis not present

## 2023-02-05 DIAGNOSIS — D2261 Melanocytic nevi of right upper limb, including shoulder: Secondary | ICD-10-CM | POA: Diagnosis not present

## 2023-02-05 DIAGNOSIS — L821 Other seborrheic keratosis: Secondary | ICD-10-CM | POA: Diagnosis not present

## 2023-02-05 DIAGNOSIS — D2262 Melanocytic nevi of left upper limb, including shoulder: Secondary | ICD-10-CM | POA: Diagnosis not present

## 2023-02-05 DIAGNOSIS — D225 Melanocytic nevi of trunk: Secondary | ICD-10-CM | POA: Diagnosis not present

## 2023-02-05 DIAGNOSIS — D2272 Melanocytic nevi of left lower limb, including hip: Secondary | ICD-10-CM | POA: Diagnosis not present

## 2023-02-13 DIAGNOSIS — M25511 Pain in right shoulder: Secondary | ICD-10-CM | POA: Diagnosis not present

## 2023-02-13 DIAGNOSIS — M25512 Pain in left shoulder: Secondary | ICD-10-CM | POA: Diagnosis not present

## 2023-02-13 DIAGNOSIS — M75112 Incomplete rotator cuff tear or rupture of left shoulder, not specified as traumatic: Secondary | ICD-10-CM | POA: Diagnosis not present

## 2023-02-13 DIAGNOSIS — M7502 Adhesive capsulitis of left shoulder: Secondary | ICD-10-CM | POA: Diagnosis not present

## 2023-04-02 DIAGNOSIS — R972 Elevated prostate specific antigen [PSA]: Secondary | ICD-10-CM | POA: Diagnosis not present

## 2023-06-25 DIAGNOSIS — H5704 Mydriasis: Secondary | ICD-10-CM | POA: Diagnosis not present

## 2023-06-25 DIAGNOSIS — H43813 Vitreous degeneration, bilateral: Secondary | ICD-10-CM | POA: Diagnosis not present

## 2023-06-25 DIAGNOSIS — H2512 Age-related nuclear cataract, left eye: Secondary | ICD-10-CM | POA: Diagnosis not present

## 2023-08-23 ENCOUNTER — Other Ambulatory Visit: Payer: Self-pay | Admitting: Urology

## 2023-08-23 DIAGNOSIS — R972 Elevated prostate specific antigen [PSA]: Secondary | ICD-10-CM

## 2023-09-18 ENCOUNTER — Ambulatory Visit: Payer: Medicare Other

## 2023-09-18 DIAGNOSIS — Z23 Encounter for immunization: Secondary | ICD-10-CM

## 2023-09-18 DIAGNOSIS — Z719 Counseling, unspecified: Secondary | ICD-10-CM

## 2023-09-18 NOTE — Progress Notes (Signed)
Patient seen in nurse clinic.  Patient inquired about Prevnar 20 and RSV vaccine which were recommended by patient's physican.  VIS provided. Discussed both vaccines.  Patient wanted to only to get Prevnar 20 today and wait to get RSV.  Prevnar 20 given - tolerated well. [facility] updated and copy provided. Patient stated understanding and had no questions.

## 2023-10-10 ENCOUNTER — Ambulatory Visit
Admission: RE | Admit: 2023-10-10 | Discharge: 2023-10-10 | Disposition: A | Discharge status: Home / Self Care | Payer: Medicare Other | Source: Ambulatory Visit | Attending: Urology | Admitting: Urology

## 2023-10-10 DIAGNOSIS — R972 Elevated prostate specific antigen [PSA]: Secondary | ICD-10-CM

## 2023-10-10 MED ORDER — GADOPICLENOL 0.5 MMOL/ML IV SOLN
8.0000 mL | Freq: Once | INTRAVENOUS | Status: AC | PRN
Start: 2023-10-10 — End: 2023-10-10
  Administered 2023-10-10: 8 mL via INTRAVENOUS
